# Patient Record
Sex: Female | Born: 1940 | Race: White | Hispanic: Yes | Smoking: Never smoker
Health system: Southern US, Community
[De-identification: ages and names within clinical notes are randomized; demographics above are authoritative.]

## PROBLEM LIST (undated history)

## (undated) DIAGNOSIS — Q752 Hypertelorism: Secondary | ICD-10-CM

## (undated) DIAGNOSIS — F32A Depression, unspecified: Secondary | ICD-10-CM

## (undated) DIAGNOSIS — F329 Major depressive disorder, single episode, unspecified: Secondary | ICD-10-CM

## (undated) HISTORY — DX: Major depressive disorder, single episode, unspecified: F32.9

## (undated) HISTORY — DX: Depression, unspecified: F32.A

## (undated) HISTORY — DX: Hypertelorism: Q75.2

---

## 2001-08-24 ENCOUNTER — Encounter (INDEPENDENT_AMBULATORY_CARE_PROVIDER_SITE_OTHER): Payer: Self-pay | Admitting: *Deleted

## 2001-08-24 ENCOUNTER — Ambulatory Visit (HOSPITAL_COMMUNITY): Admission: RE | Admit: 2001-08-24 | Discharge: 2001-08-24 | Payer: Self-pay | Admitting: Family Medicine

## 2001-08-24 ENCOUNTER — Encounter: Payer: Self-pay | Admitting: Family Medicine

## 2001-08-28 ENCOUNTER — Ambulatory Visit (HOSPITAL_COMMUNITY): Admission: RE | Admit: 2001-08-28 | Discharge: 2001-08-28 | Payer: Self-pay | Admitting: *Deleted

## 2003-02-14 ENCOUNTER — Encounter: Payer: Self-pay | Admitting: Family Medicine

## 2003-02-14 ENCOUNTER — Ambulatory Visit (HOSPITAL_COMMUNITY): Admission: RE | Admit: 2003-02-14 | Discharge: 2003-02-14 | Payer: Self-pay | Admitting: Family Medicine

## 2003-02-19 ENCOUNTER — Ambulatory Visit (HOSPITAL_COMMUNITY): Admission: RE | Admit: 2003-02-19 | Discharge: 2003-02-19 | Payer: Self-pay | Admitting: Family Medicine

## 2003-02-27 ENCOUNTER — Encounter: Payer: Self-pay | Admitting: Emergency Medicine

## 2003-02-27 ENCOUNTER — Emergency Department (HOSPITAL_COMMUNITY): Admission: EM | Admit: 2003-02-27 | Discharge: 2003-02-27 | Payer: Self-pay | Admitting: Emergency Medicine

## 2003-12-05 ENCOUNTER — Encounter: Admission: RE | Admit: 2003-12-05 | Discharge: 2003-12-05 | Payer: Self-pay | Admitting: Internal Medicine

## 2003-12-13 ENCOUNTER — Encounter: Admission: RE | Admit: 2003-12-13 | Discharge: 2003-12-13 | Payer: Self-pay | Admitting: Internal Medicine

## 2003-12-17 ENCOUNTER — Encounter: Admission: RE | Admit: 2003-12-17 | Discharge: 2003-12-17 | Payer: Self-pay | Admitting: Internal Medicine

## 2004-01-16 ENCOUNTER — Encounter: Admission: RE | Admit: 2004-01-16 | Discharge: 2004-01-16 | Payer: Self-pay | Admitting: Internal Medicine

## 2004-02-08 ENCOUNTER — Ambulatory Visit (HOSPITAL_COMMUNITY): Admission: RE | Admit: 2004-02-08 | Discharge: 2004-02-08 | Payer: Self-pay | Admitting: Internal Medicine

## 2004-02-14 ENCOUNTER — Ambulatory Visit (HOSPITAL_COMMUNITY): Admission: RE | Admit: 2004-02-14 | Discharge: 2004-02-14 | Payer: Self-pay | Admitting: Internal Medicine

## 2004-02-14 ENCOUNTER — Encounter: Admission: RE | Admit: 2004-02-14 | Discharge: 2004-02-14 | Payer: Self-pay | Admitting: Internal Medicine

## 2004-02-20 ENCOUNTER — Ambulatory Visit (HOSPITAL_COMMUNITY): Admission: RE | Admit: 2004-02-20 | Discharge: 2004-02-20 | Payer: Self-pay

## 2004-02-24 ENCOUNTER — Ambulatory Visit (HOSPITAL_COMMUNITY): Admission: RE | Admit: 2004-02-24 | Discharge: 2004-02-24 | Payer: Self-pay | Admitting: Internal Medicine

## 2004-04-01 ENCOUNTER — Encounter: Admission: RE | Admit: 2004-04-01 | Discharge: 2004-04-01 | Payer: Self-pay | Admitting: Internal Medicine

## 2004-04-13 ENCOUNTER — Ambulatory Visit (HOSPITAL_COMMUNITY): Admission: RE | Admit: 2004-04-13 | Discharge: 2004-04-13 | Payer: Self-pay | Admitting: Internal Medicine

## 2004-07-15 ENCOUNTER — Ambulatory Visit: Payer: Self-pay | Admitting: Internal Medicine

## 2004-07-16 ENCOUNTER — Ambulatory Visit: Payer: Self-pay | Admitting: Internal Medicine

## 2004-09-16 ENCOUNTER — Ambulatory Visit: Payer: Self-pay | Admitting: Internal Medicine

## 2005-02-17 ENCOUNTER — Ambulatory Visit (HOSPITAL_COMMUNITY): Admission: RE | Admit: 2005-02-17 | Discharge: 2005-02-17 | Payer: Self-pay | Admitting: Internal Medicine

## 2005-02-18 ENCOUNTER — Ambulatory Visit: Payer: Self-pay | Admitting: Internal Medicine

## 2005-02-23 ENCOUNTER — Ambulatory Visit: Payer: Self-pay | Admitting: Internal Medicine

## 2005-04-06 ENCOUNTER — Ambulatory Visit: Payer: Self-pay | Admitting: Obstetrics & Gynecology

## 2005-04-28 ENCOUNTER — Ambulatory Visit: Payer: Self-pay | Admitting: Internal Medicine

## 2005-05-12 ENCOUNTER — Ambulatory Visit: Payer: Self-pay | Admitting: Internal Medicine

## 2005-05-18 ENCOUNTER — Ambulatory Visit: Payer: Self-pay | Admitting: Obstetrics & Gynecology

## 2005-06-16 ENCOUNTER — Ambulatory Visit: Payer: Self-pay | Admitting: Internal Medicine

## 2006-05-02 ENCOUNTER — Ambulatory Visit: Payer: Self-pay | Admitting: Internal Medicine

## 2006-06-10 ENCOUNTER — Ambulatory Visit: Payer: Self-pay | Admitting: Internal Medicine

## 2006-08-19 ENCOUNTER — Ambulatory Visit: Payer: Self-pay | Admitting: Internal Medicine

## 2006-09-01 ENCOUNTER — Ambulatory Visit: Payer: Self-pay | Admitting: Internal Medicine

## 2006-09-01 LAB — CONVERTED CEMR LAB
ALT: 22 units/L (ref 0–40)
AST: 20 units/L (ref 0–37)
Chol/HDL Ratio, serum: 5.7
Hgb A1c MFr Bld: 7 % — ABNORMAL HIGH (ref 4.6–6.0)
LDL DIRECT: 214.2 mg/dL
VLDL: 24 mg/dL (ref 0–40)

## 2006-09-05 ENCOUNTER — Ambulatory Visit (HOSPITAL_COMMUNITY): Admission: RE | Admit: 2006-09-05 | Discharge: 2006-09-05 | Payer: Self-pay | Admitting: Internal Medicine

## 2006-09-06 ENCOUNTER — Ambulatory Visit: Payer: Self-pay | Admitting: Internal Medicine

## 2006-09-20 ENCOUNTER — Ambulatory Visit: Payer: Self-pay | Admitting: Internal Medicine

## 2006-10-06 ENCOUNTER — Encounter: Admission: RE | Admit: 2006-10-06 | Discharge: 2006-12-26 | Payer: Self-pay | Admitting: Neurology

## 2006-10-06 DIAGNOSIS — M1990 Unspecified osteoarthritis, unspecified site: Secondary | ICD-10-CM | POA: Insufficient documentation

## 2006-10-06 DIAGNOSIS — E119 Type 2 diabetes mellitus without complications: Secondary | ICD-10-CM

## 2006-10-06 DIAGNOSIS — F411 Generalized anxiety disorder: Secondary | ICD-10-CM | POA: Insufficient documentation

## 2006-10-06 DIAGNOSIS — H269 Unspecified cataract: Secondary | ICD-10-CM | POA: Insufficient documentation

## 2006-10-06 DIAGNOSIS — E785 Hyperlipidemia, unspecified: Secondary | ICD-10-CM

## 2006-10-06 DIAGNOSIS — IMO0001 Reserved for inherently not codable concepts without codable children: Secondary | ICD-10-CM

## 2006-10-06 DIAGNOSIS — D320 Benign neoplasm of cerebral meninges: Secondary | ICD-10-CM | POA: Insufficient documentation

## 2006-10-06 DIAGNOSIS — H409 Unspecified glaucoma: Secondary | ICD-10-CM | POA: Insufficient documentation

## 2006-10-06 DIAGNOSIS — M3500 Sicca syndrome, unspecified: Secondary | ICD-10-CM

## 2006-10-06 DIAGNOSIS — E039 Hypothyroidism, unspecified: Secondary | ICD-10-CM | POA: Insufficient documentation

## 2006-10-06 DIAGNOSIS — F329 Major depressive disorder, single episode, unspecified: Secondary | ICD-10-CM

## 2006-10-06 DIAGNOSIS — K219 Gastro-esophageal reflux disease without esophagitis: Secondary | ICD-10-CM | POA: Insufficient documentation

## 2006-10-06 DIAGNOSIS — R809 Proteinuria, unspecified: Secondary | ICD-10-CM | POA: Insufficient documentation

## 2007-01-19 ENCOUNTER — Telehealth: Payer: Self-pay | Admitting: *Deleted

## 2007-03-22 ENCOUNTER — Ambulatory Visit: Payer: Self-pay | Admitting: Internal Medicine

## 2007-03-24 ENCOUNTER — Ambulatory Visit: Payer: Self-pay | Admitting: Internal Medicine

## 2007-03-28 ENCOUNTER — Encounter (INDEPENDENT_AMBULATORY_CARE_PROVIDER_SITE_OTHER): Payer: Self-pay | Admitting: *Deleted

## 2007-03-28 LAB — CONVERTED CEMR LAB
ALT: 21 units/L (ref 0–40)
AST: 23 units/L (ref 0–37)
CO2: 31 meq/L (ref 19–32)
Chloride: 107 meq/L (ref 96–112)
Creatinine, Ser: 0.8 mg/dL (ref 0.4–1.2)
Direct LDL: 122.6 mg/dL
Hgb A1c MFr Bld: 8 % — ABNORMAL HIGH (ref 4.6–6.0)
Potassium: 3.2 meq/L — ABNORMAL LOW (ref 3.5–5.1)
Sodium: 145 meq/L (ref 135–145)
TSH: 0.82 microintl units/mL (ref 0.35–5.50)
Triglycerides: 133 mg/dL (ref 0–149)

## 2007-04-05 ENCOUNTER — Ambulatory Visit: Payer: Self-pay | Admitting: Internal Medicine

## 2007-05-02 ENCOUNTER — Ambulatory Visit: Payer: Self-pay | Admitting: Internal Medicine

## 2007-05-16 ENCOUNTER — Ambulatory Visit: Payer: Self-pay | Admitting: Internal Medicine

## 2007-05-16 ENCOUNTER — Encounter: Payer: Self-pay | Admitting: Internal Medicine

## 2007-05-26 ENCOUNTER — Telehealth (INDEPENDENT_AMBULATORY_CARE_PROVIDER_SITE_OTHER): Payer: Self-pay | Admitting: *Deleted

## 2007-06-19 ENCOUNTER — Telehealth (INDEPENDENT_AMBULATORY_CARE_PROVIDER_SITE_OTHER): Payer: Self-pay | Admitting: *Deleted

## 2007-07-31 ENCOUNTER — Encounter (INDEPENDENT_AMBULATORY_CARE_PROVIDER_SITE_OTHER): Payer: Self-pay | Admitting: *Deleted

## 2007-08-23 ENCOUNTER — Telehealth (INDEPENDENT_AMBULATORY_CARE_PROVIDER_SITE_OTHER): Payer: Self-pay | Admitting: *Deleted

## 2007-08-23 ENCOUNTER — Ambulatory Visit: Payer: Self-pay | Admitting: Internal Medicine

## 2007-08-23 DIAGNOSIS — J309 Allergic rhinitis, unspecified: Secondary | ICD-10-CM | POA: Insufficient documentation

## 2007-08-28 LAB — CONVERTED CEMR LAB
BUN: 11 mg/dL (ref 6–23)
Calcium: 9.8 mg/dL (ref 8.4–10.5)
Glucose, Bld: 179 mg/dL — ABNORMAL HIGH (ref 70–99)
Hgb A1c MFr Bld: 7.5 % — ABNORMAL HIGH (ref 4.6–6.0)
Sodium: 139 meq/L (ref 135–145)
TSH: 1.15 microintl units/mL (ref 0.35–5.50)

## 2007-09-27 ENCOUNTER — Ambulatory Visit: Payer: Self-pay | Admitting: Internal Medicine

## 2007-09-27 ENCOUNTER — Telehealth (INDEPENDENT_AMBULATORY_CARE_PROVIDER_SITE_OTHER): Payer: Self-pay | Admitting: *Deleted

## 2007-09-29 ENCOUNTER — Encounter: Admission: RE | Admit: 2007-09-29 | Discharge: 2007-09-29 | Payer: Self-pay | Admitting: Internal Medicine

## 2007-10-03 ENCOUNTER — Telehealth (INDEPENDENT_AMBULATORY_CARE_PROVIDER_SITE_OTHER): Payer: Self-pay | Admitting: *Deleted

## 2007-10-05 ENCOUNTER — Telehealth: Payer: Self-pay | Admitting: Internal Medicine

## 2007-10-06 ENCOUNTER — Telehealth (INDEPENDENT_AMBULATORY_CARE_PROVIDER_SITE_OTHER): Payer: Self-pay | Admitting: *Deleted

## 2007-10-12 ENCOUNTER — Ambulatory Visit: Payer: Self-pay | Admitting: Cardiology

## 2007-10-13 ENCOUNTER — Telehealth: Payer: Self-pay | Admitting: Internal Medicine

## 2007-10-24 ENCOUNTER — Telehealth (INDEPENDENT_AMBULATORY_CARE_PROVIDER_SITE_OTHER): Payer: Self-pay | Admitting: *Deleted

## 2007-10-24 ENCOUNTER — Ambulatory Visit: Payer: Self-pay | Admitting: Internal Medicine

## 2007-10-25 ENCOUNTER — Telehealth (INDEPENDENT_AMBULATORY_CARE_PROVIDER_SITE_OTHER): Payer: Self-pay | Admitting: *Deleted

## 2007-11-03 ENCOUNTER — Telehealth: Payer: Self-pay | Admitting: Internal Medicine

## 2007-11-04 ENCOUNTER — Ambulatory Visit: Payer: Self-pay | Admitting: Family Medicine

## 2007-11-09 ENCOUNTER — Ambulatory Visit: Payer: Self-pay | Admitting: Internal Medicine

## 2007-11-10 ENCOUNTER — Telehealth (INDEPENDENT_AMBULATORY_CARE_PROVIDER_SITE_OTHER): Payer: Self-pay | Admitting: *Deleted

## 2007-11-10 LAB — CONVERTED CEMR LAB
AST: 18 units/L (ref 0–37)
Direct LDL: 220 mg/dL
Eosinophils Absolute: 0.2 10*3/uL (ref 0.0–0.6)
Eosinophils Relative: 2.7 % (ref 0.0–5.0)
HDL: 39.7 mg/dL (ref >39.0)
MCHC: 33.9 g/dL (ref 30.0–36.0)
MCV: 83.1 fL (ref 78.0–100.0)
Microalb Creat Ratio: 74 mg/g — ABNORMAL HIGH (ref 0.0–30.0)
Neutro Abs: 3.3 10*3/uL (ref 1.4–7.7)
Neutrophils Relative %: 58 % (ref 43.0–77.0)
RBC: 4.75 M/uL (ref 3.87–5.11)
RDW: 13.1 % (ref 11.5–14.6)
Total CHOL/HDL Ratio: 8.2
Triglycerides: 244 mg/dL (ref 0–149)
WBC: 5.7 10*3/uL (ref 4.5–10.5)

## 2007-11-13 ENCOUNTER — Telehealth (INDEPENDENT_AMBULATORY_CARE_PROVIDER_SITE_OTHER): Payer: Self-pay | Admitting: *Deleted

## 2007-11-17 ENCOUNTER — Ambulatory Visit: Payer: Self-pay

## 2007-11-17 ENCOUNTER — Encounter: Payer: Self-pay | Admitting: Internal Medicine

## 2007-11-22 ENCOUNTER — Ambulatory Visit (HOSPITAL_COMMUNITY): Admission: RE | Admit: 2007-11-22 | Discharge: 2007-11-22 | Payer: Self-pay | Admitting: Internal Medicine

## 2007-11-24 ENCOUNTER — Ambulatory Visit: Payer: Self-pay | Admitting: Internal Medicine

## 2007-11-25 ENCOUNTER — Encounter: Payer: Self-pay | Admitting: Internal Medicine

## 2007-11-27 ENCOUNTER — Telehealth (INDEPENDENT_AMBULATORY_CARE_PROVIDER_SITE_OTHER): Payer: Self-pay | Admitting: *Deleted

## 2007-11-30 ENCOUNTER — Encounter: Admission: RE | Admit: 2007-11-30 | Discharge: 2007-11-30 | Payer: Self-pay | Admitting: Internal Medicine

## 2007-12-06 ENCOUNTER — Encounter: Payer: Self-pay | Admitting: Internal Medicine

## 2007-12-13 ENCOUNTER — Ambulatory Visit: Payer: Self-pay | Admitting: Obstetrics & Gynecology

## 2008-01-01 ENCOUNTER — Telehealth (INDEPENDENT_AMBULATORY_CARE_PROVIDER_SITE_OTHER): Payer: Self-pay | Admitting: *Deleted

## 2008-01-01 ENCOUNTER — Telehealth: Payer: Self-pay | Admitting: Internal Medicine

## 2008-06-28 ENCOUNTER — Telehealth (INDEPENDENT_AMBULATORY_CARE_PROVIDER_SITE_OTHER): Payer: Self-pay | Admitting: *Deleted

## 2008-07-17 ENCOUNTER — Encounter: Admission: RE | Admit: 2008-07-17 | Discharge: 2008-07-17 | Payer: Self-pay | Admitting: Family Medicine

## 2008-12-06 ENCOUNTER — Encounter: Admission: RE | Admit: 2008-12-06 | Discharge: 2008-12-06 | Payer: Self-pay | Admitting: Family Medicine

## 2009-06-27 IMAGING — CT CT ANGIO CHEST
1 of 4 series · 12 of 36 positions shown · IV contrast (Omnipaque 300)
Comparison: None.

CLINICAL DATA: Abnormal chest x-ray. 
 CT ANGIOGRAPHY OF CHEST:
TECHNIQUE: Multidetector CT imaging of the chest was performed during bolus injection of intravenous contrast.  Multiplanar CT angiographic image reconstructions were generated to evaluate the vascular anatomy.
 Contrast:  80 cc Omnipaque 300.

[Series 2: thoracica_wo 3.0 b30f st · axial · 0.69mm/px · z∈[-268,-37]mm · 12 of 92 slices shown]
[im 8/92  lung]
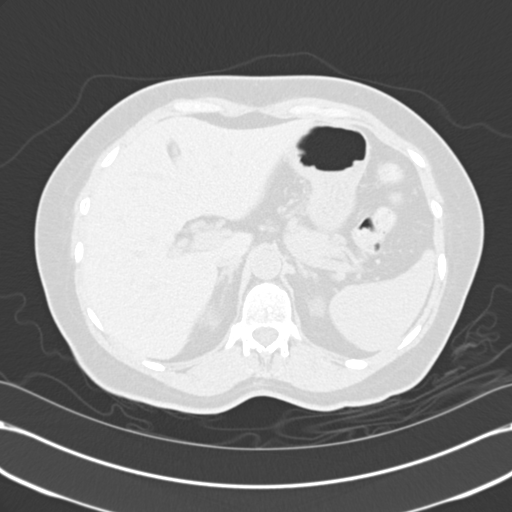
[im 15/92  mediastinal]
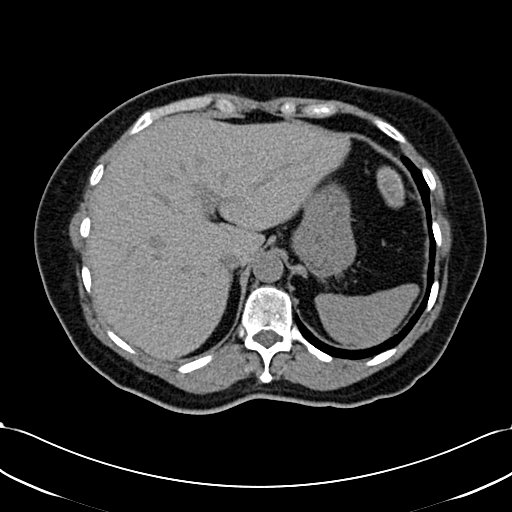
[im 22/92  lung]
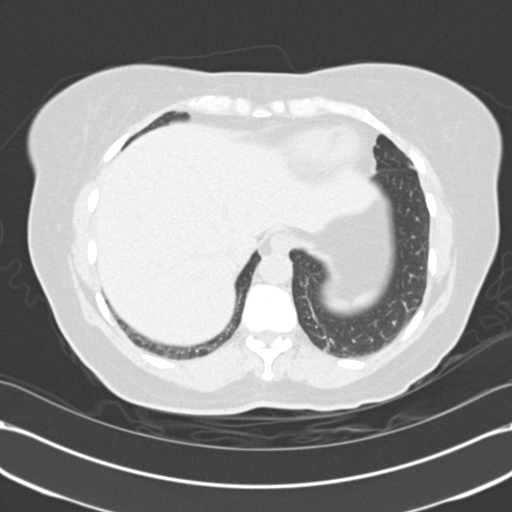
[im 29/92  mediastinal]
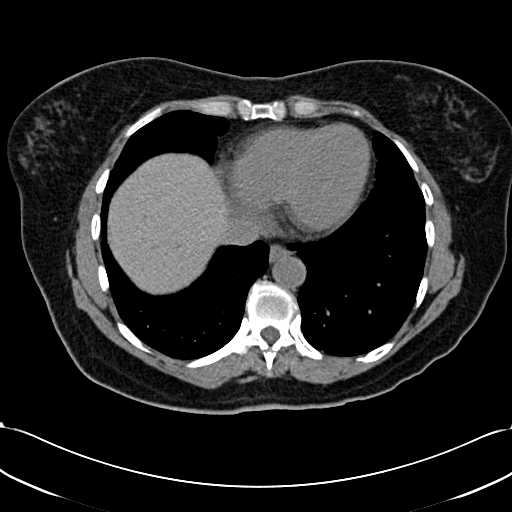
[im 36/92  lung]
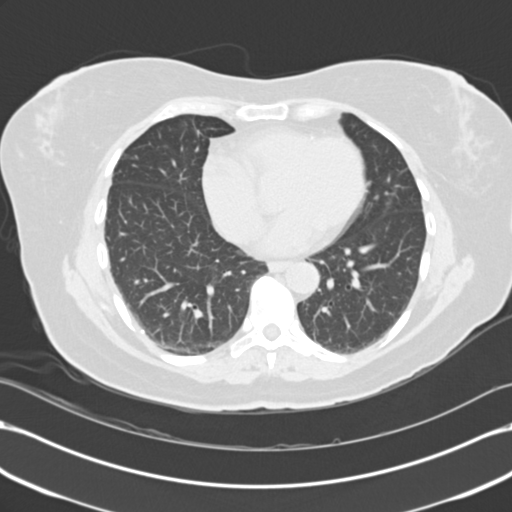
[im 43/92  mediastinal]
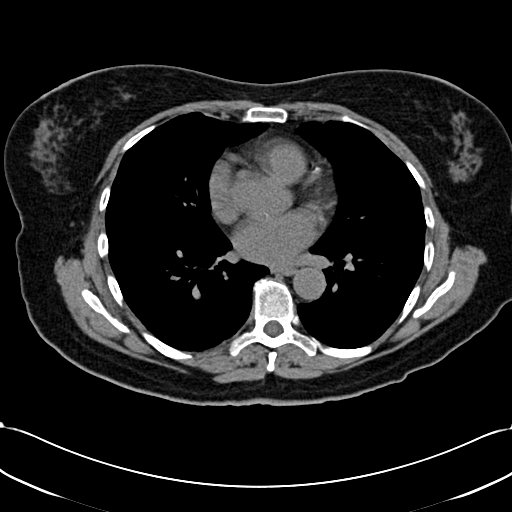
[im 50/92  lung]
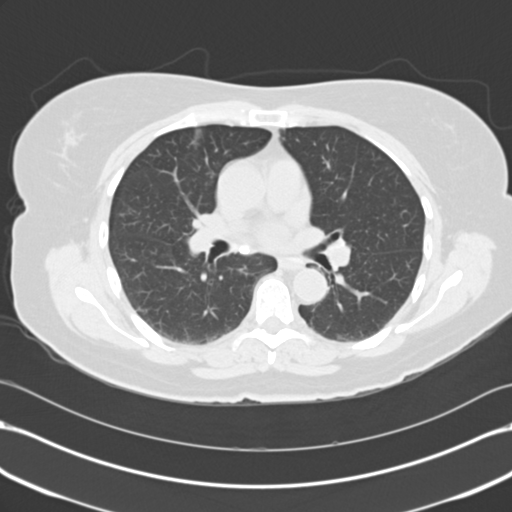
[im 57/92  mediastinal]
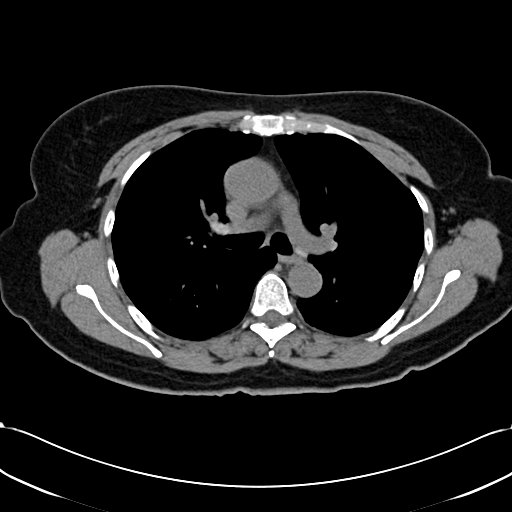
[im 64/92  lung]
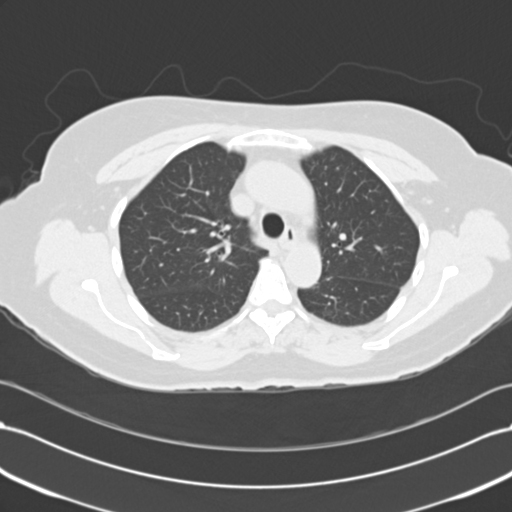
[im 71/92  mediastinal]
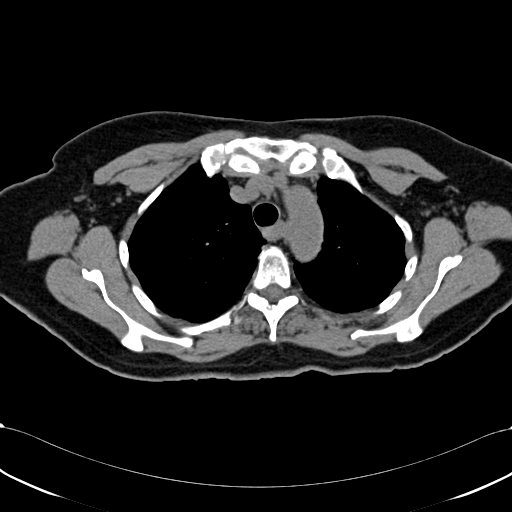
[im 78/92  lung]
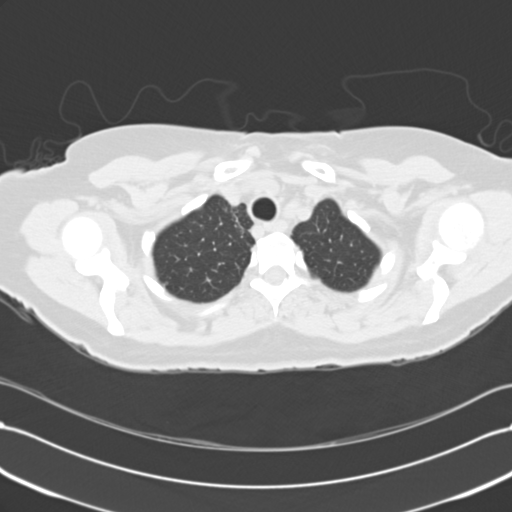
[im 85/92  mediastinal]
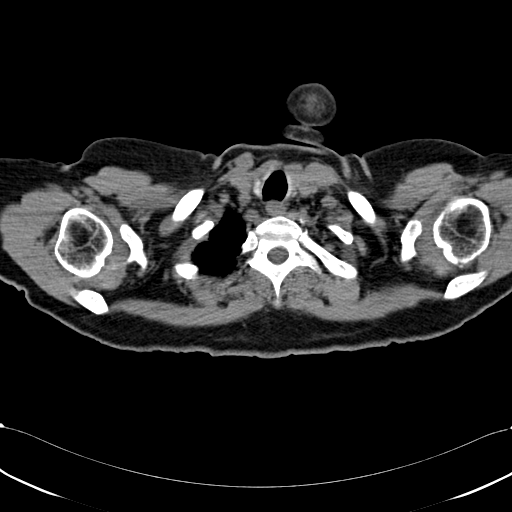

[12 of 36 positions shown; findings below may reference images not displayed]

FINDINGS: Coronary artery calcifications are present.  There is a calcified subcarinal lymph node compatible with old granulomatous disease.  Negative abnormal mediastinal or hilar adenopathy by CT measurement criteria. 
 No filling defects are seen in the pulmonary arterial tree to suggest acute pulmonary thromboembolism.  The aorta is normal in caliber without dissection, transection, or focal saccular aneurysm.  An aberrant right subclavian artery is noted.  Mild atherosclerotic changes of the descending aorta are seen without significant penetrating ulcer or aneurysm.  
 No pneumothoraces or effusions are seen.
 Linear atelectasis in the right middle lobe and lung bases is present.  No focal mass or consolidation.  The airway is patent.
IMPRESSION: 1.  No evidence of aortic aneurysm or dissection. 
 2.  No pulmonary thromboembolism. 
 3.  Aberrant right subclavian artery, a normal variant and of no clinical concern. 
 4.  Coronary artery calcifications. 
 5.  Old granulomatous disease.

## 2011-03-09 NOTE — Assessment & Plan Note (Signed)
[facility]                                 ON-CALL NOTE   AARON, BOEH                          MRN:          [id number]  DATE:11/04/2007                            DOB:          July 24, 1941    TIME OF CALL:  9:32am.   TELEPHONE NUMBER:  [phone]   OBJECTIVE:  The patient has a bad cold and was seen by Dr. Drue Novel on the  3rd of December and then by Dr. Alwyn Ren for a cold. She still has it. She  wants to be seen. She was told to come in to the Saturday clinic.   PRIMARY CARE PHYSICIAN:  Dr. Drue Novel.   HOME OFFICE:  [facility].     Arta Silence, MD  Electronically Signed    RNS/MedQ  DD: 11/04/2007  DT: 11/05/2007  Job #: [id number]

## 2011-03-12 NOTE — Assessment & Plan Note (Signed)
[facility]                           GASTROENTEROLOGY OFFICE NOTE   Zoe Kennedy, Zoe Kennedy                          MRN:          [id number]  DATE:09/06/2006                            DOB:          December 20, 1940    REASON FOR CONSULTATION:  Abdominal pain, altered bowel habits and reflux  disease.   HISTORY:  This is a 70 year old Hispanic female with a history of diabetes  mellitus, hypothyroidism, gastroesophageal reflux disease, and low back pain  with resultant disability. She is referred through the courtesy of Dr. Drue Novel  regarding the above-listed issues and for consideration of colonoscopy. The  patient reports having undergone either a sigmoidoscopy or colonoscopy in  [place] greater than 5 years ago. This apparently for the purposes of  screening. She is uncertain of the results. She also underwent upper  endoscopy for chronic reflux disease. She is not aware of any particular  abnormalities. She has chronic problems with indigestion and heartburn. When  taking a proton pump inhibitor such as omeprazole, she does well. She denies  dysphagia, vomiting or weight loss. She recently had a prescription for  omeprazole, which expired. She requests a prescription today.   Next, she reports a 87-month history of constipation alternating with  diarrhea. She states that this is new. Also, some lower abdominal discomfort  associated with defecation. Pain seems to be improved after going to the  bathroom. She will take Imodium for diarrhea. Does not take laxatives for  constipation. She denies rectal bleeding. No appetite change or weight loss  as previously mentioned. She comprehends well, but is a bit difficult to  understand when she speaks.   PAST MEDICAL HISTORY:  1. Diabetes.  2. Hypothyroidism.  3. Chronic back pain.  4. Gastroesophageal reflux disease.  5. Status post hysterectomy with bilateral oophorectomy in 2000.  6. Status post rhinoplasty.  7. Status post face lift.  8. Status post unspecified right mastoid surgery, complicated by right-      sided hearing loss.   ALLERGIES:  No known drug allergies.   CURRENT MEDICATIONS:  1. Humulin insulin 25 units subcutaneous q. p.m.  2. Metformin 500 mg b.i.d.  3. Hydrochlorothiazide 25 mg daily.  4. Levothyroxine 50 mcg daily.  5. Zoloft 200 mg daily.  6. Lipitor 40 mg daily.  7. Estrace cream.  8. Omeprazole 20 mg daily (recently without medication).   FAMILY HISTORY:  No family history of gastrointestinal malignancy. Mother  with diabetes, father with heart disease.   SOCIAL HISTORY:  The patient is married without children. She lives with her  husband. She attended college. She is disabled from her back pain. She does  not smoke or use alcohol.   REVIEW OF SYSTEMS:  Per diagnostic evaluation form.   PHYSICAL EXAMINATION:  Well-appearing female in no acute distress. Blood  pressure is 130/82, heart rate is 72 and regular, weight is 127 pounds.  HEENT: Sclera anicteric. Conjunctivae are pink. Oral mucosa intact. Her nose  is bandaged from prior surgery. There is no adenopathy.  LUNGS:  Are clear.  HEART: Is regular.  ABDOMEN:  Soft without tenderness, mass or hernia. Good bowel sounds heard.  No organomegaly.  RECTAL: Is deferred.  EXTREMITIES: Are without edema.   IMPRESSION:  73. A 70 year old female who presents with 18-month history of alternating      bowel habits as well as lower abdominal discomfort associated with and      improved after defecation. Features most consistent with irritable      bowel. Unclear to me, though her symptoms might have been going on      longer than 4 months. In any event, she requires investigation.  2. Status post hysterectomy and bilateral salpingo-oophorectomy per the      patient report.  3. Gastroesophageal reflux disease without alarm symptoms. Currently      recurrent heartburn off omeprazole.  4. Diabetes mellitus.  5. Other  general medical problems as discussed above.   RECOMMENDATIONS:  1. Colonoscopy with polypectomy if indicated. Ashby Dawes of the procedure as      well as the risks, benefits and alternatives have been reviewed. She      understood and agreed to proceed. We will hold her oral hypoglycemic      agent the day of the exam and recommend one-half of her normal insulin      the evening prior to the exam.  2. Reflux precautions.  3. Omeprazole 20 mg daily. A prescription with multiple refills has been      provided.  4. Ongoing general medical care with Dr. Drue Novel.    Wilhemina Bonito. Eda Keys., MD  Electronically Signed   JNP/MedQ  DD: 09/07/2006  DT: 09/07/2006  Job #: [id number]   cc:   Willow Ora, MD

## 2011-03-12 NOTE — Group Therapy Note (Signed)
NAMEKALANY, DIEKMANN                 ACCOUNT NO.:  [id number]   MEDICAL RECORD NO.:  [id number]          PATIENT TYPE:  WOC   LOCATION:  [facility]                   FACILITY:  [facility]   PHYSICIAN:  Elsie Lincoln, MD      DATE OF BIRTH:  24-Jul-1941   DATE OF SERVICE:  04/06/2005                                    CLINIC NOTE   Patient is a 70 year old G0 female who is menopausal since age 22 who  presents for a Pap smear.  She also has a complaint of dyspareunia for three  years.  Patient is an [facility] patient from [facility] and that doctor apparently  told her she needs yearly Pap smears, although she has had a hysterectomy  for benign reasons and no longer has a cervix.  She has never had any  abnormal Pap smears.  We did some education today to explain that she no  longer needs Pap smears as she does not have a cervix and has never had any  abnormal Pap smears.  She does need a pelvic examination and breast  examination each year and if she remains sexually active with new partners  she will need to be screened for sexually transmitted diseases.  Currently,  the patient has been having dyspareunia for three years.  It is at the  introitus and she feels like there is a muscle falling in.  She has never  been worked up for this in the past.   PAST MEDICAL HISTORY:  1.  Diabetes for 20 years.  2.  Hypothyroidism.  3.  Fibromyalgia.  4.  Glaucoma.  5.  Cataracts.  6.  Hypercholesterolemia.  7.  Sjogren's syndrome.   PAST SURGICAL HISTORY:  Rhinoplasty.   PAST GYNECOLOGICAL HISTORY:  Hysterectomy in 2000 for fibroid tumors and  pelvic pain.  Menopause at age 72.  Never has been pregnant.  No history of  sexually transmitted diseases.  No history of abnormal Pap smear.  No  history of ovarian cyst.   ALLERGIES:  None.   MEDICATIONS:  Metformin, Glyburide, levothyroxine, Lipitor, Zetia, some eye  drops, and insulin.   Last mammogram April 2006.  Last Pap smear 2001.   REVIEW OF  SYSTEMS:  No chest pain, shortness of breath, nausea, vomiting,  diarrhea.  Patient does occasionally leak urine approximately once a day  with coughing or sneezing.  At this time the patient states she does not  want to have any intervention done.   PHYSICAL EXAMINATION:  GENERAL:  Well-nourished, well-developed, in no  apparent distress.  HEENT:  Normocephalic, atraumatic.  Has a slight tremor when she speaks.  Her head nods.  BREASTS:  Supple.  No masses.  No lymphadenopathy.  No skin changes.  ABDOMEN:  Soft, nontender, nondistended.  No rebound.  No guarding.  Incision Pfannenstiel well healed.  GENITALIA:  Tanner V.  Vulva no lesion.  Vagina atrophic.  Patient also has  a good Kegel and is able to relax her levator muscles.  With a wet Q-tip her  labia majora, minora, and Vespula were palpated.  The patient has a  marked  sensitivity at the [facility] glands and vestibular glands all around the  introitus and hymenal ring.  On bimanual there is no masses, nontender.  Rectovaginal nontender.  No masses.   ASSESSMENT/PLAN:  A 70 year old female with dyspareunia for three years.  1.  Patient does have atrophic vaginitis.  Will treat with Vagifem or      Estrace.  Patient given prescriptions for both.  She will see which one      she can afford.  She will do q.h.s. for two weeks, then twice a week.  2.  Mammogram was done April 2006.  Will get the result.  3.  Patient needs Hemoccult blood testing next time she comes.  4.  We need records from her hysterectomy in [place] next time she comes.  5.  Will do an STD screening next time she comes.  6.  Return to clinic in six weeks.       KL/MEDQ  D:  04/06/2005  T:  04/06/2005  Job:  [id number]

## 2011-03-12 NOTE — Group Therapy Note (Signed)
NAMEJONELLE, BANN                 ACCOUNT NO.:  [id number]   MEDICAL RECORD NO.:  [id number]          PATIENT TYPE:  WOC   LOCATION[facility]  [facility]                   FACILITY:  [facility]   PHYSICIAN:  Elsie Lincoln, MD      DATE OF BIRTH:  01/16/1941   DATE OF SERVICE:  05/18/2005                                    CLINIC NOTE   Patient is a 70 year old female who presents for followup for atrophic  vaginitis.  She has been on the Estrace cream with good results.  She no  longer has the constant pain, however, did have sex once since then and had  a little discomfort, but nothing excruciating as before.  When inquired  about the patient's sexual relationship and the first thing she spends time  with she says she is not very attracted to him.  He is borderline mentally  retarded with an IQ of approximately 43.  She said it is a relationship of  convenience where he needs her for mental help and she needs him for  physical help so she doubts that sex will ever be pleasant with this man.  He is not abusive.  We talked at length about libido and menopause and I  think she has come to terms with why her libido is low at this point in her  life with her current medical conditions and also the lack of sexual arousal  involving her partner.  Patient was given both prescription for Estrace  cream and Vagifem and she is going to choose the cheaper one after  investigating these at pharmacies.  Patient will return in one year.  BIRADS  1 mammogram was noted in April 2006 and she no longer needs Pap smears as  she had hysterectomy for benign reasons.       KL/MEDQ  D:  05/18/2005  T:  05/19/2005  Job:  [id number]

## 2012-06-06 ENCOUNTER — Encounter: Payer: Self-pay | Admitting: Internal Medicine

## 2012-12-12 ENCOUNTER — Ambulatory Visit (INDEPENDENT_AMBULATORY_CARE_PROVIDER_SITE_OTHER): Payer: Medicare HMO | Admitting: Psychiatry

## 2012-12-12 ENCOUNTER — Encounter (HOSPITAL_COMMUNITY): Payer: Self-pay | Admitting: Psychiatry

## 2012-12-12 VITALS — BP 138/98 | Ht 62.5 in | Wt 128.0 lb

## 2012-12-12 DIAGNOSIS — F3181 Bipolar II disorder: Secondary | ICD-10-CM | POA: Insufficient documentation

## 2012-12-12 DIAGNOSIS — F3130 Bipolar disorder, current episode depressed, mild or moderate severity, unspecified: Secondary | ICD-10-CM

## 2012-12-12 MED ORDER — SERTRALINE HCL 100 MG PO TABS: 100.0000 mg | ORAL_TABLET | Freq: Every day | ORAL | Status: DC

## 2012-12-12 MED ORDER — LAMOTRIGINE 25 MG PO TABS: ORAL_TABLET | ORAL | Status: DC
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
## 2013-01-09 ENCOUNTER — Encounter (HOSPITAL_COMMUNITY): Payer: Self-pay | Admitting: Psychiatry

## 2013-01-09 ENCOUNTER — Ambulatory Visit (INDEPENDENT_AMBULATORY_CARE_PROVIDER_SITE_OTHER): Payer: Medicare HMO | Admitting: Psychiatry

## 2013-01-09 VITALS — BP 124/82 | Ht 62.5 in | Wt 137.0 lb

## 2013-01-09 DIAGNOSIS — F3189 Other bipolar disorder: Secondary | ICD-10-CM

## 2013-01-09 DIAGNOSIS — F3181 Bipolar II disorder: Secondary | ICD-10-CM

## 2013-01-09 MED ORDER — LAMOTRIGINE 100 MG PO TABS: 100.0000 mg | ORAL_TABLET | Freq: Every day | ORAL | Status: DC

## 2013-01-09 NOTE — Progress Notes (Signed)
   [facility] Follow-up Outpatient Visit  Maille Halliwell September 09, 1941   Subjective: The patient is a 72 year old female who was seen for an initial psychiatric assessment on 12/12/2012 after referral by her neurologist. The patient had a substantial psychiatric history, with a prior diagnosis of bipolar disorder. Her only medication at our initial appointment was Zoloft. The patient was having issues with irritability and anger. She felt that her prior lithium had destroyed her thyroid. At her initial appointment, I started her on Lamictal for mood stability. I started with initial dose of 25 mg daily and increase to 50 mg daily. I also continued her Zoloft 100 mg daily. The patient presents today. She is no longer seeing Dr. Craige Cotta. She reports good sleep with an over-the-counter sleep agent. She feels like the Lamictal is helping, and she has been a little more even. Her appetite has been good. She has had occasional low mood. She reports that she rarely cries. She normally has an anger reaction. She has not been as angry. She only became irritated once this week. She will be starting therapy tomorrow with Merlene Morse. The patient reports her blood sugars have been up and down. She uses NovoLog as needed. [facility]'s followed by [facility] for her primary care. The patient tells stories of her life in [place]. 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 She was considered to be a spinster. She answered an ad, which is how she met her husband. He is 10 years older. This is what brought her to the [place].  Filed Vitals:   01/09/13 1306  BP: 124/82    Mental Status Examination  Appearance: Neatly dressed Alert: Yes Attention: good  Cooperative: Yes Eye Contact: Good Speech: Regular rate rhythm and volume. Strong accent. Psychomotor Activity: Normal Memory/Concentration: Intact Oriented: person, place, time/date and situation Mood: Euthymic Affect: Appropriate Thought  Processes and Associations: Logical Fund of Knowledge: Fair Thought Content: No suicidal homicidal thoughts Insight: Fair Judgement: Fair  Diagnosis: Bipolar disorder type II, most recent episode manic  Treatment Plan: I will increase her Lamictal to 100 mg daily. I will continue her Zoloft 100 mg daily. I will see her back in one month. Patient will start therapy tomorrow. Patient may call with concerns.  Jamse Mead, MD

## 2013-01-10 ENCOUNTER — Ambulatory Visit (INDEPENDENT_AMBULATORY_CARE_PROVIDER_SITE_OTHER): Payer: Medicare HMO | Admitting: Licensed Clinical Social Worker

## 2013-01-10 DIAGNOSIS — F3130 Bipolar disorder, current episode depressed, mild or moderate severity, unspecified: Secondary | ICD-10-CM
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
## 2013-02-01 ENCOUNTER — Encounter: Payer: Self-pay | Admitting: Internal Medicine

## 2013-02-13 ENCOUNTER — Ambulatory Visit (HOSPITAL_COMMUNITY): Payer: Self-pay | Admitting: Licensed Clinical Social Worker

## 2013-02-14 ENCOUNTER — Ambulatory Visit (HOSPITAL_COMMUNITY): Payer: Self-pay | Admitting: Psychiatry

## 2013-03-16 ENCOUNTER — Encounter (HOSPITAL_COMMUNITY): Payer: Self-pay | Admitting: Psychiatry

## 2013-03-16 ENCOUNTER — Ambulatory Visit (INDEPENDENT_AMBULATORY_CARE_PROVIDER_SITE_OTHER): Payer: Medicare HMO | Admitting: Psychiatry

## 2013-03-16 VITALS — BP 138/85 | Ht 62.5 in | Wt 142.0 lb

## 2013-03-16 DIAGNOSIS — F3189 Other bipolar disorder: Secondary | ICD-10-CM

## 2013-03-16 DIAGNOSIS — F3181 Bipolar II disorder: Secondary | ICD-10-CM

## 2013-03-16 MED ORDER — LAMOTRIGINE 150 MG PO TABS: 150.0000 mg | ORAL_TABLET | Freq: Every day | ORAL | Status: DC

## 2013-03-16 MED ORDER — SERTRALINE HCL 100 MG PO TABS: 100.0000 mg | ORAL_TABLET | Freq: Every day | ORAL | Status: DC

## 2013-03-16 NOTE — Progress Notes (Signed)
[facility] Behavioral Health Follow-up Outpatient Visit  Zoe Kennedy 01-01-1941   Subjective: The patient is a 72 year old female who has been followed by [facility] since February of 2014. The patient had a substantial psychiatric history, with a prior diagnosis of bipolar disorder. I have diagnosed with bipolar disorder type II. At her last appointment, I continued her Zoloft 100 mg daily, and increased her Lamictal to 100 mg daily since patient was seeing improvement from it. She presents today. I have not seen her since March. She was sick last month. She had 2 dental extractions. Patient is concerned right now about finances. She has been getting a lot of bills. She is angry that her family does not help out financially, since she help them out so much in the past. She reports only one nephew will contact her. She has 5. He did visit in September with his wife and children and was very disrespectful. The patient feels the Lamictal is helping. She is more even. She does not feel 100%, but feels that she is on her way. She's only had 2 issues with 2 people since last appointment. She is less aggressive and agitated. She is sleeping well with over-the-counter sleep medicine. She and her husband are doing well. She has not been social or getting out. She likes to read. She's been reading since the age of 12, and feels it helps her [place].  Filed Vitals:   03/16/13 1502  BP: 138/85   Active Ambulatory Problems    Diagnosis Date Noted  . MENINGIOMA 10/06/2006  . HYPOTHYROIDISM 10/06/2006  . DIABETES MELLITUS, TYPE II 10/06/2006  . HYPERLIPIDEMIA 10/06/2006  . ANXIETY 10/06/2006  . DEPRESSION 10/06/2006  . GLAUCOMA NOS 10/06/2006  . CATARACT NOS 10/06/2006  . ALLERGIC RHINITIS 08/23/2007  . GERD 10/06/2006  . SJOGREN'S SYNDROME 10/06/2006  . OSTEOARTHRITIS 10/06/2006  . FIBROMYALGIA 10/06/2006  . 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 Bipolar II disorder, most recent episode major  depressive 12/12/2012   Resolved Ambulatory Problems    Diagnosis Date Noted  . No Resolved Ambulatory Problems   No Additional Past Medical History   Current Outpatient Prescriptions on File Prior to Visit  Medication Sig Dispense Refill  . amLODipine (NORVASC) 10 MG tablet       . amLODipine (NORVASC) 5 MG tablet       . atorvastatin (LIPITOR) 10 MG tablet       . levothyroxine (SYNTHROID, LEVOTHROID) 50 MCG tablet       . losartan (COZAAR) 50 MG tablet       . pravastatin (PRAVACHOL) 40 MG tablet        No current facility-administered medications on file prior to visit.   Review of Systems - General ROS: negative for - sleep disturbance or weight loss Psychological ROS: negative for - anxiety or depression Cardiovascular ROS: no chest pain or dyspnea on exertion Musculoskeletal ROS: negative for - gait disturbance or muscular weakness Neurological ROS: negative for - dizziness, headaches or seizures  Mental Status Examination  Appearance: Neatly dressed Alert: Yes Attention: good  Cooperative: Yes Eye Contact: Good Speech: Regular rate rhythm and volume. Strong accent. Psychomotor Activity: Normal Memory/Concentration: Intact Oriented: person, place, time/date and situation Mood: Euthymic Affect: Appropriate Thought Processes and Associations: Logical Fund of Knowledge: Fair Thought Content: No suicidal homicidal thoughts Insight: Fair Judgement: Fair  Diagnosis: Bipolar disorder type II, most recent episode manic  Treatment Plan: I will increase her Lamictal to 150 mg daily. I will continue  her Zoloft 100 mg daily. I will see her back in 6 weeks.Patient may call with concerns.  Jamse Mead, MD

## 2013-05-01 ENCOUNTER — Ambulatory Visit (HOSPITAL_COMMUNITY): Payer: Self-pay | Admitting: Psychiatry

## 2014-08-15 DIAGNOSIS — I10 Essential (primary) hypertension: Secondary | ICD-10-CM | POA: Insufficient documentation

## 2015-12-24 DIAGNOSIS — G4700 Insomnia, unspecified: Secondary | ICD-10-CM | POA: Insufficient documentation

## 2015-12-24 DIAGNOSIS — H9071 Mixed conductive and sensorineural hearing loss, unilateral, right ear, with unrestricted hearing on the contralateral side: Secondary | ICD-10-CM | POA: Insufficient documentation

## 2016-01-14 DIAGNOSIS — H95191 Other disorders following mastoidectomy, right ear: Secondary | ICD-10-CM | POA: Insufficient documentation

## 2016-01-21 DIAGNOSIS — H40013 Open angle with borderline findings, low risk, bilateral: Secondary | ICD-10-CM | POA: Insufficient documentation

## 2016-04-12 DIAGNOSIS — H25011 Cortical age-related cataract, right eye: Secondary | ICD-10-CM | POA: Insufficient documentation

## 2016-05-03 DIAGNOSIS — H2511 Age-related nuclear cataract, right eye: Secondary | ICD-10-CM | POA: Insufficient documentation

## 2016-06-23 DIAGNOSIS — Z961 Presence of intraocular lens: Secondary | ICD-10-CM | POA: Insufficient documentation

## 2016-10-29 DIAGNOSIS — H43811 Vitreous degeneration, right eye: Secondary | ICD-10-CM | POA: Insufficient documentation

## 2016-10-29 DIAGNOSIS — D2210 Melanocytic nevi of unspecified eyelid, including canthus: Secondary | ICD-10-CM | POA: Insufficient documentation

## 2016-10-29 DIAGNOSIS — H52223 Regular astigmatism, bilateral: Secondary | ICD-10-CM | POA: Insufficient documentation

## 2017-03-18 DIAGNOSIS — L219 Seborrheic dermatitis, unspecified: Secondary | ICD-10-CM | POA: Insufficient documentation

## 2017-06-28 ENCOUNTER — Telehealth (HOSPITAL_COMMUNITY): Payer: Self-pay

## 2017-08-23 ENCOUNTER — Ambulatory Visit (HOSPITAL_COMMUNITY): Payer: Self-pay | Admitting: Psychiatry

## 2017-10-04 ENCOUNTER — Ambulatory Visit (HOSPITAL_COMMUNITY): Payer: Self-pay | Admitting: Psychiatry

## 2017-11-17 ENCOUNTER — Ambulatory Visit (HOSPITAL_COMMUNITY): Payer: Self-pay | Admitting: Psychiatry

## 2017-11-22 ENCOUNTER — Ambulatory Visit (HOSPITAL_COMMUNITY): Payer: Self-pay | Admitting: Psychiatry

## 2018-01-12 ENCOUNTER — Ambulatory Visit (HOSPITAL_COMMUNITY): Payer: Self-pay | Admitting: Psychiatry

## 2018-02-17 ENCOUNTER — Ambulatory Visit (HOSPITAL_COMMUNITY): Payer: Medicare HMO | Admitting: Psychiatry

## 2018-02-17 DIAGNOSIS — G5139 Clonic hemifacial spasm, unspecified: Secondary | ICD-10-CM | POA: Insufficient documentation

## 2018-02-17 DIAGNOSIS — I771 Stricture of artery: Secondary | ICD-10-CM | POA: Insufficient documentation

## 2018-02-17 DIAGNOSIS — G514 Facial myokymia: Secondary | ICD-10-CM | POA: Insufficient documentation

## 2018-04-12 ENCOUNTER — Encounter (HOSPITAL_COMMUNITY): Payer: Self-pay | Admitting: Psychiatry

## 2018-04-12 ENCOUNTER — Ambulatory Visit (INDEPENDENT_AMBULATORY_CARE_PROVIDER_SITE_OTHER): Payer: Medicare Other | Admitting: Psychiatry

## 2018-04-12 VITALS — BP 130/70 | HR 64 | Ht 60.75 in | Wt 125.8 lb

## 2018-04-12 DIAGNOSIS — F3162 Bipolar disorder, current episode mixed, moderate: Secondary | ICD-10-CM

## 2018-04-12 DIAGNOSIS — H9192 Unspecified hearing loss, left ear: Secondary | ICD-10-CM | POA: Insufficient documentation

## 2018-04-12 MED ORDER — LAMOTRIGINE 25 MG PO TABS: ORAL_TABLET | ORAL | 0 refills | Status: DC
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
## 2018-05-24 ENCOUNTER — Ambulatory Visit (HOSPITAL_COMMUNITY): Payer: Self-pay | Admitting: Psychiatry

## 2018-06-21 ENCOUNTER — Ambulatory Visit (HOSPITAL_COMMUNITY): Payer: Medicare Other | Admitting: Psychiatry

## 2018-06-21 ENCOUNTER — Encounter (HOSPITAL_COMMUNITY): Payer: Self-pay | Admitting: Psychiatry

## 2018-06-21 VITALS — BP 122/68 | HR 64 | Ht 61.0 in | Wt 122.0 lb

## 2018-06-21 DIAGNOSIS — Z79899 Other long term (current) drug therapy: Secondary | ICD-10-CM

## 2018-06-21 DIAGNOSIS — F4321 Adjustment disorder with depressed mood: Secondary | ICD-10-CM

## 2018-06-21 DIAGNOSIS — F3181 Bipolar II disorder: Secondary | ICD-10-CM | POA: Diagnosis not present

## 2018-06-21 DIAGNOSIS — G5139 Clonic hemifacial spasm, unspecified: Secondary | ICD-10-CM | POA: Diagnosis not present

## 2018-06-21 MED ORDER — SERTRALINE HCL 100 MG PO TABS: 100.0000 mg | ORAL_TABLET | Freq: Every day | ORAL | 2 refills | Status: DC

## 2018-06-21 MED ORDER — DIVALPROEX SODIUM 250 MG PO DR TAB: DELAYED_RELEASE_TABLET | ORAL | 2 refills | Status: DC
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
## 2018-06-27 ENCOUNTER — Ambulatory Visit (HOSPITAL_COMMUNITY): Payer: Self-pay | Admitting: Psychiatry

## 2018-09-14 ENCOUNTER — Ambulatory Visit (INDEPENDENT_AMBULATORY_CARE_PROVIDER_SITE_OTHER): Payer: Medicare Other | Admitting: Psychiatry

## 2018-09-14 ENCOUNTER — Encounter (HOSPITAL_COMMUNITY): Payer: Self-pay | Admitting: Psychiatry

## 2018-09-14 DIAGNOSIS — Z5181 Encounter for therapeutic drug level monitoring: Secondary | ICD-10-CM | POA: Diagnosis not present

## 2018-09-14 DIAGNOSIS — F3181 Bipolar II disorder: Secondary | ICD-10-CM

## 2018-09-14 MED ORDER — DIVALPROEX SODIUM 500 MG PO DR TAB: 500.0000 mg | DELAYED_RELEASE_TABLET | Freq: Every day | ORAL | 0 refills | Status: DC

## 2018-09-14 MED ORDER — DIVALPROEX SODIUM 500 MG PO DR TAB: 500.0000 mg | DELAYED_RELEASE_TABLET | Freq: Every day | ORAL | 2 refills | Status: DC

## 2018-09-14 MED ORDER — SERTRALINE HCL 100 MG PO TABS: 100.0000 mg | ORAL_TABLET | Freq: Every day | ORAL | 2 refills | Status: DC

## 2018-09-14 MED ORDER — SERTRALINE HCL 100 MG PO TABS: 100.0000 mg | ORAL_TABLET | Freq: Every day | ORAL | 0 refills | Status: DC
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
## 2018-10-30 ENCOUNTER — Ambulatory Visit (HOSPITAL_COMMUNITY): Payer: Medicare Other | Admitting: Psychiatry

## 2018-10-30 ENCOUNTER — Ambulatory Visit (INDEPENDENT_AMBULATORY_CARE_PROVIDER_SITE_OTHER): Payer: Medicare Other | Admitting: Licensed Clinical Social Worker

## 2018-10-30 ENCOUNTER — Ambulatory Visit (HOSPITAL_COMMUNITY): Payer: Medicare Other | Admitting: Licensed Clinical Social Worker

## 2018-10-30 DIAGNOSIS — F3181 Bipolar II disorder: Secondary | ICD-10-CM
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
## 2018-11-08 DIAGNOSIS — H04123 Dry eye syndrome of bilateral lacrimal glands: Secondary | ICD-10-CM | POA: Insufficient documentation

## 2018-11-08 DIAGNOSIS — H40023 Open angle with borderline findings, high risk, bilateral: Secondary | ICD-10-CM | POA: Insufficient documentation

## 2018-11-16 NOTE — Progress Notes (Deleted)
Psychiatric Initial Adult Assessment   Patient Identification: Zoe Kennedy MRN:  [id number] Date of Evaluation:  11/16/2018 Referral Source: *** Chief Complaint:   Visit Diagnosis: No diagnosis found.  History of Present Illness:   Zoe Kennedy is a 78 y.o. year old female with a history of bipolar disorder, Sjogrens syndrome, hypothyroidism, diabetes, who is referred for bipolar disorder.    Associated Signs/Symptoms: Depression Symptoms:  {DEPRESSION SYMPTOMS:20000} (Hypo) Manic Symptoms:  {BHH MANIC SYMPTOMS:22872} Anxiety Symptoms:  {BHH ANXIETY SYMPTOMS:22873} Psychotic Symptoms:  {BHH PSYCHOTIC SYMPTOMS:22874} PTSD Symptoms: {BHH PTSD SYMPTOMS:22875}  Past Psychiatric History: ***  Previous Psychotropic Medications: {YES/NO:21197}  Substance Abuse History in the last 12 months:  {yes no:314532}  Consequences of Substance Abuse: {BHH CONSEQUENCES OF SUBSTANCE ABUSE:22880}  Past Medical History: No past medical history on file. No past surgical history on file.  Family Psychiatric History: ***  Family History: No family history on file.  Social History:   Social History   Socioeconomic History  . Marital status: Married    Spouse name: Not on file  . Number of children: Not on file  . Years of education: Not on file  . Highest education level: Not on file  Occupational History  . Not on file  Social Needs  . Financial resource strain: Not on file  . Food insecurity:    Worry: Not on file    Inability: Not on file  . Transportation needs:    Medical: Not on file    Non-medical: Not on file  Tobacco Use  . Smoking status: Never Smoker  . Smokeless tobacco: Never Used  Substance and Sexual Activity  . Alcohol use: No  . Drug use: No  . Sexual activity: Not Currently  Lifestyle  . Physical activity:    Days per week: Not on file    Minutes per session: Not on file  . Stress: Not on file  Relationships  . Social connections:    Talks on phone: Not on  file    Gets together: Not on file    Attends religious service: Not on file    Active member of club or organization: Not on file    Attends meetings of clubs or organizations: Not on file    Relationship status: Not on file  Other Topics Concern  . Not on file  Social History Narrative  . Not on file    Additional Social History: ***  Allergies:  No Known Allergies  Metabolic Disorder Labs: Lab Results  Component Value Date   HGBA1C 8.3 (H) 11/09/2007   No results found for: PROLACTIN Lab Results  Component Value Date   CHOL 324 (HH) 11/09/2007   TRIG 244 (HH) 11/09/2007   HDL 39.7 11/09/2007   CHOLHDL 8.2 CALC 11/09/2007   VLDL 49 (H) 11/09/2007   Lab Results  Component Value Date   TSH 1.15 08/23/2007    Therapeutic Level Labs: No results found for: LITHIUM No results found for: CBMZ No results found for: VALPROATE  Current Medications: Current Outpatient Medications  Medication Sig Dispense Refill  . alendronate (FOSAMAX) 70 MG tablet Take 70 mg by mouth every 7 (seven) days.    Marland Kitchen amLODipine (NORVASC) 10 MG tablet Take by mouth.    Marland Kitchen aspirin EC 81 MG tablet Take by mouth.    Marland Kitchen atorvastatin (LIPITOR) 40 MG tablet Take 40 mg by mouth at bedtime.    . chlorhexidine (PERIDEX) 0.12 % solution     . divalproex (DEPAKOTE) 500 MG DR  tablet Take 1 tablet (500 mg total) by mouth at bedtime. 90 tablet 0  . erythromycin ophthalmic ointment Place into both eyes nightly.    . fenofibrate 160 MG tablet Take 160 mg by mouth daily.    Marland Kitchen guaiFENesin-Codeine 200-10 MG/5ML LIQD Take by mouth.    . Influenza vac split quadrivalent PF (FLUZONE HIGH-DOSE) 0.5 ML injection     . insulin aspart (NOVOLOG) 100 UNIT/ML injection Inject into the skin.    Marland Kitchen insulin detemir (LEVEMIR) 100 UNIT/ML injection Inject into the skin.    . Insulin Pen Needle (ADVOCATE INSULIN PEN NEEDLES) 31G X 5 MM MISC by Does not apply route.    . Insulin Pen Needle (FIFTY50 PEN NEEDLES) 31G X 5 MM MISC by  Miscellaneous route Three (3) times a day with a meal.    . Insulin Syringes, Disposable, (KMART VALU INS SYR .3CC/30G) U-100 0.3 ML MISC by Does not apply route.    . irbesartan-hydrochlorothiazide (AVALIDE) 150-12.5 MG tablet Take 150 tablets by mouth daily.    Marland Kitchen levothyroxine (SYNTHROID, LEVOTHROID) 50 MCG tablet     . losartan (COZAAR) 50 MG tablet     . Multiple Vitamin (MULTI-VITAMINS) TABS Take by mouth.    . neomycin-polymyxin-hydrocortisone (CORTISPORIN) 3.5-10000-1 OTIC suspension   1  . ONE TOUCH ULTRA TEST test strip     . pravastatin (PRAVACHOL) 40 MG tablet     . sertraline (ZOLOFT) 100 MG tablet Take 1 tablet (100 mg total) by mouth daily. 135 tablet 0   No current facility-administered medications for this visit.     Musculoskeletal: Strength & Muscle Tone: within normal limits Gait & Station: normal Patient leans: N/A  Psychiatric Specialty Exam: ROS  There were no vitals taken for this visit.There is no height or weight on file to calculate BMI.  General Appearance: Fairly Groomed  Eye Contact:  Good  Speech:  Clear and Coherent  Volume:  Normal  Mood:  {BHH MOOD:22306}  Affect:  {Affect (PAA):22687}  Thought Process:  Coherent  Orientation:  Full (Time, Place, and Person)  Thought Content:  Logical  Suicidal Thoughts:  {ST/HT (PAA):22692}  Homicidal Thoughts:  {ST/HT (PAA):22692}  Memory:  Immediate;   Good  Judgement:  {Judgement (PAA):22694}  Insight:  {Insight (PAA):22695}  Psychomotor Activity:  Normal  Concentration:  Concentration: Good and Attention Span: Good  Recall:  Good  Fund of Knowledge:Good  Language: Good  Akathisia:  No  Handed:  Right  AIMS (if indicated):  not done  Assets:  Communication Skills Desire for Improvement  ADL's:  Intact  Cognition: WNL  Sleep:  {BHH GOOD/FAIR/POOR:22877}   Screenings:   Assessment and Plan:  Assessment  Plan  The patient demonstrates the following risk factors for suicide: Chronic risk  factors for suicide include: {Chronic Risk Factors for OQHUTML:46503546}. Acute risk factors for suicide include: {Acute Risk Factors for FKCLEXN:17001749}. Protective factors for this patient include: {Protective Factors for Suicide SWHQ:75916384}. Considering these factors, the overall suicide risk at this point appears to be {Desc; low/moderate/high:110033}. Patient {ACTION; IS/IS YKZ:99357017} appropriate for outpatient follow up.    Norman Clay, MD 1/23/20209:48 PM

## 2018-11-18 ENCOUNTER — Ambulatory Visit (INDEPENDENT_AMBULATORY_CARE_PROVIDER_SITE_OTHER): Payer: Medicare Other | Admitting: Psychiatry

## 2018-11-18 ENCOUNTER — Other Ambulatory Visit: Payer: Self-pay

## 2018-11-18 ENCOUNTER — Encounter (HOSPITAL_COMMUNITY): Payer: Self-pay | Admitting: Psychiatry

## 2018-11-18 DIAGNOSIS — F3181 Bipolar II disorder: Secondary | ICD-10-CM

## 2018-11-18 MED ORDER — VALPROIC ACID 250 MG PO CAPS: 500.0000 mg | ORAL_CAPSULE | Freq: Every day | ORAL | 1 refills | Status: DC

## 2018-11-18 MED ORDER — SERTRALINE HCL 100 MG PO TABS: 100.0000 mg | ORAL_TABLET | Freq: Every day | ORAL | 0 refills | Status: DC

## 2018-11-18 NOTE — Progress Notes (Signed)
BH MD/PA/NP OP Progress Note  11/18/2018 1:48 PM Zoe Kennedy  MRN:  [id number]  Chief Complaint:  Chief Complaint    Follow-up; Other; Depression; Medication Refill     HPI:  Zoe Kennedy is a 78 y.o. year old female with a history of bipolar disorder, Sjogrens syndrome, hypothyroidism, diabetes, who presents for follow-up appointment for bipolar disorder.  Per chart review, she was seen by Dr. Doyne Keel in November.  Depakote was uptitrated.   Patient states that she has been feeling depressed.  She misses her husband, who she was married for 43 years. He deceased in 31-May-2018.  It has been difficult for the patient to go through the holidays without him.  She feels that she has nobody, although she does report some support from people at church. She invited them on holidays. She lives by herself, no children.  She feels depressed.  She has fair concentration.  She is a little more motivated.  She felt SI last month, although she denies intent or plans.  She denies any recent SI and also states that she is [place]. She feels anxious at times.  She denies decreased need for sleep or euphoria.  She has not increased Depakote to 500 mg. She did not do it as she thought that it needs to be gradually uptitrated. (she ran out of medication a few days ago) Provided psychoeducation of uptitration of Depakote. She has not done blood test at her last visit which was directed per chart. She denies flashback, nightmares.   Past trials of medication: lithium- thyroid issues, Abilify- muscle twitch  Wt Readings from Last 3 Encounters:  11/18/18 130 lb 6.4 oz (59.1 kg)  06/21/18 122 lb (55.3 kg)  04/12/18 125 lb 12.8 oz (57.1 kg)    Visit Diagnosis:    ICD-10-CM   1. 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 I have reviewed the history. No updates at this time.   Patient denies any history of psychiatric inpatient treatment but endorsed history of bipolar disorder diagnosed in 38.  She was given Zoloft when she was in [place] and then lithium which helped her a lot but also because thyroid problems.  She continued Zoloft when she moved to [place] after her husband retired.  She saw Dr. Laurance Flatten and prescribed Lamictal which worked very well for her but she was noncompliant with follow-ups.  Patient denies any history of suicidal attempt but reported history of mania, grandiosity, extreme irritability and mood swings.  Recently she was given Abilify which worked but also caused muscle twitching.  Past Medical History:  Past Medical History:  Diagnosis Date  . Depression   . Hypertelorism    History reviewed. No pertinent surgical history.  Family Psychiatric History: Please see initial evaluation for full details. I have reviewed the history. No updates at this time.     Family History: History reviewed. No pertinent family history.  Social History:  Social History   Socioeconomic History  . Marital status: Widowed    Spouse name: Not on file  . Number of children: 0  . Years of education: Not on file  . Highest education level: Not on file  Occupational History  . Not on file  Social Needs  . Financial resource strain: Not very hard  .  Food insecurity:    Worry: Sometimes true    Inability: Sometimes true  . Transportation needs:    Medical: No    Non-medical: No  Tobacco Use  . Smoking status: Never Smoker  . Smokeless tobacco: Never Used  Substance and Sexual Activity  . Alcohol use: No  . Drug use: No  . Sexual activity: Not Currently  Lifestyle  . Physical activity:    Days per week: 3 days    Minutes per session: 30 min  . Stress: Not on file  Relationships  . Social connections:    Talks on phone: Not on file    Gets together: Not on file     Attends religious service: Not on file    Active member of club or organization: No    Attends meetings of clubs or organizations: Never    Relationship status: Widowed  Other Topics Concern  . Not on file  Social History Narrative  . Not on file    Allergies: No Known Allergies  Metabolic Disorder Labs: Lab Results  Component Value Date   HGBA1C 8.3 (H) 11/09/2007   No results found for: PROLACTIN Lab Results  Component Value Date   CHOL 324 (HH) 11/09/2007   TRIG 244 (HH) 11/09/2007   HDL 39.7 11/09/2007   CHOLHDL 8.2 CALC 11/09/2007   VLDL 49 (H) 11/09/2007   Lab Results  Component Value Date   TSH 1.15 08/23/2007   TSH 0.82 03/24/2007    Therapeutic Level Labs: No results found for: LITHIUM No results found for: VALPROATE No components found for:  CBMZ  Current Medications: Current Outpatient Medications  Medication Sig Dispense Refill  . alendronate (FOSAMAX) 70 MG tablet Take 70 mg by mouth every 7 (seven) days.    Marland Kitchen amLODipine (NORVASC) 10 MG tablet Take by mouth.    Marland Kitchen aspirin EC 81 MG tablet Take by mouth.    Marland Kitchen atorvastatin (LIPITOR) 40 MG tablet Take 40 mg by mouth at bedtime.    . divalproex (DEPAKOTE) 500 MG DR tablet Take 1 tablet (500 mg total) by mouth at bedtime. 90 tablet 0  . erythromycin ophthalmic ointment Place into both eyes nightly.    . fenofibrate 160 MG tablet Take 160 mg by mouth daily.    Marland Kitchen guaiFENesin-Codeine 200-10 MG/5ML LIQD Take by mouth.    Marland Kitchen HUMALOG 100 UNIT/ML injection INJECT 20 UNITS SUBCUTANEOUSLY THREE TIMES DAILY BEFORE MEAL(S) AS DIRECTED    . Influenza vac split quadrivalent PF (FLUZONE HIGH-DOSE) 0.5 ML injection     . insulin aspart (NOVOLOG) 100 UNIT/ML injection Inject into the skin.    Marland Kitchen insulin detemir (LEVEMIR) 100 UNIT/ML injection Inject into the skin.    . Insulin Pen Needle (ADVOCATE INSULIN PEN NEEDLES) 31G X 5 MM MISC by Does not apply route.    . Insulin Pen Needle (FIFTY50 PEN NEEDLES) 31G X 5 MM MISC by  Miscellaneous route Three (3) times a day with a meal.    . Insulin Syringes, Disposable, (KMART VALU INS SYR .3CC/30G) U-100 0.3 ML MISC by Does not apply route.    . irbesartan-hydrochlorothiazide (AVALIDE) 150-12.5 MG tablet Take 150 tablets by mouth daily.    Marland Kitchen levothyroxine (SYNTHROID, LEVOTHROID) 50 MCG tablet     . losartan (COZAAR) 50 MG tablet     . meloxicam (MOBIC) 15 MG tablet Take by mouth.    . Multiple Vitamin (MULTI-VITAMINS) TABS Take by mouth.    . neomycin-polymyxin-hydrocortisone (CORTISPORIN) 3.5-10000-1 OTIC suspension  1  . ONE TOUCH ULTRA TEST test strip     . pravastatin (PRAVACHOL) 40 MG tablet     . sertraline (ZOLOFT) 100 MG tablet Take 1 tablet (100 mg total) by mouth daily. 90 tablet 0  . valproic acid (DEPAKENE) 250 MG capsule Take 2 capsules (500 mg total) by mouth at bedtime. 60 capsule 1   No current facility-administered medications for this visit.      Musculoskeletal: Strength & Muscle Tone: within normal limits Gait & Station: normal Patient leans: N/A  Psychiatric Specialty Exam: Review of Systems  Psychiatric/Behavioral: Positive for depression. Negative for hallucinations, memory loss, substance abuse and suicidal ideas. The patient is nervous/anxious and has insomnia.   All other systems reviewed and are negative.   Blood pressure (!) 171/85, pulse 69, temperature 99.1 F (37.3 C), temperature source Oral, weight 130 lb 6.4 oz (59.1 kg).Body mass index is 24.64 kg/m.  General Appearance: Fairly Groomed  Eye Contact:  Good  Speech:  Clear and Coherent  Volume:  Normal  Mood:  Depressed  Affect:  Appropriate, Congruent and slightly restricted  Thought Process:  Coherent  Orientation:  Full (Time, Place, and Person)  Thought Content: Logical   Suicidal Thoughts:  No  Homicidal Thoughts:  No  Memory:  Immediate;   Good  Judgement:  Good  Insight:  Fair  Psychomotor Activity:  Normal  Concentration:  Concentration: Good and Attention  Span: Good  Recall:  Good  Fund of Knowledge: Good  Language: Good  Akathisia:  No  Handed:  Right  AIMS (if indicated): not done  Assets:  Communication Skills Desire for Improvement  ADL's:  Intact  Cognition: WNL  Sleep:  Fair   Screenings:   Assessment and Plan:  Zoe Kennedy is a 78 y.o. year old female with a history of bipolar II disorder, Sjogrens syndrome, hypothyroidism, diabetes, who presents for follow-up appointment for bipolar disorder.   # Bipolar II disorder Patient continues to report depressive symptoms in the context of loss of her husband in July.  Noted that she has not uptitrate Depakote, which was directed at her last visit.  Will do up titration of Depakote to target mood dysregulation.  Will obtain labs (she prefers it to be done at family practice) to monitor levels.  Will continue sertraline to target depression.    # non adherence She has been non adherence to advice from the last visit, and it may be attributable to underlying cognitive deficits. Will consider MOCA at the next visit if that is applicable.   Plan 1. Continue sertraline 100 mg daily  2. Increase depakote 500 mg at night  3. Obtain blood test, five days after taking depakote (vpa, cmp) 3. Return to clinic in one month   The patient demonstrates the following risk factors for suicide: Chronic risk factors for suicide include: psychiatric disorder of bipolar disorder. Acute risk factors for suicide include: loss (financial, interpersonal, professional). Protective factors for this patient include: hope for the future. Considering these factors, the overall suicide risk at this point appears to be low. Patient is appropriate for outpatient follow up.  The duration of this appointment visit was 25 minutes of face-to-face time with the patient.  Greater than 50% of this time was spent in counseling, explanation of  diagnosis, planning of further management, and coordination of care.  Norman Clay, MD 11/18/2018, 1:48 PM

## 2018-11-18 NOTE — Patient Instructions (Signed)
1. Continue sertraline 100 mg daily  2. Increase depakote 500 mg at night  3. Obtain blood test, five days after taking depakote  3. Return to clinic in one month

## 2018-11-28 ENCOUNTER — Other Ambulatory Visit (HOSPITAL_COMMUNITY): Payer: Self-pay

## 2018-11-28 DIAGNOSIS — Z5181 Encounter for therapeutic drug level monitoring: Secondary | ICD-10-CM

## 2018-11-28 DIAGNOSIS — Z79899 Other long term (current) drug therapy: Secondary | ICD-10-CM

## 2018-12-02 LAB — COMPREHENSIVE METABOLIC PANEL
ALT: 20 IU/L (ref 0–32)
AST: 26 IU/L (ref 0–40)
Albumin/Globulin Ratio: 2.3 — ABNORMAL HIGH (ref 1.2–2.2)
Albumin: 4.9 g/dL — ABNORMAL HIGH (ref 3.7–4.7)
Alkaline Phosphatase: 49 IU/L (ref 39–117)
BUN/Creatinine Ratio: 19 (ref 12–28)
BUN: 14 mg/dL (ref 8–27)
Bilirubin Total: 0.3 mg/dL (ref 0.0–1.2)
CO2: 21 mmol/L (ref 20–29)
Calcium: 10 mg/dL (ref 8.7–10.3)
Chloride: 98 mmol/L (ref 96–106)
Creatinine, Ser: 0.72 mg/dL (ref 0.57–1.00)
GFR calc Af Amer: 95 mL/min/{1.73_m2} (ref >59)
GFR calc non Af Amer: 82 mL/min/{1.73_m2} (ref >59)
Globulin, Total: 2.1 g/dL (ref 1.5–4.5)
Glucose: 229 mg/dL — ABNORMAL HIGH (ref 65–99)
Potassium: 5 mmol/L (ref 3.5–5.2)
Sodium: 138 mmol/L (ref 134–144)
Total Protein: 7 g/dL (ref 6.0–8.5)

## 2018-12-02 LAB — VALPROIC ACID LEVEL: Valproic Acid Lvl: 28 ug/mL — ABNORMAL LOW (ref 50–100)

## 2018-12-04 ENCOUNTER — Ambulatory Visit (HOSPITAL_COMMUNITY): Payer: Medicare Other | Admitting: Licensed Clinical Social Worker

## 2018-12-04 ENCOUNTER — Telehealth (HOSPITAL_COMMUNITY): Payer: Self-pay | Admitting: Psychiatry

## 2018-12-04 NOTE — Telephone Encounter (Signed)
Could you contact the patient (I saw her on Saturday clinic). Reviewed labs, which are acceptable level except glucose. I would recommend she continue current dose of depakote. Advise her to continue to follow with her primary care for diabetes.

## 2018-12-06 NOTE — Telephone Encounter (Signed)
I called patient and lvm letting her know to continue her current dose of Depakote. I also advised patient to follow up with PCP about her diabetes medication to be sure it does not need to be adjusted. Call back number left for the patient if she has any questions

## 2018-12-21 ENCOUNTER — Encounter (HOSPITAL_COMMUNITY): Payer: Self-pay | Admitting: Psychiatry

## 2018-12-21 ENCOUNTER — Ambulatory Visit (INDEPENDENT_AMBULATORY_CARE_PROVIDER_SITE_OTHER): Payer: Medicare Other | Admitting: Psychiatry

## 2018-12-21 VITALS — BP 140/70 | HR 63 | Ht 60.75 in | Wt 131.0 lb

## 2018-12-21 DIAGNOSIS — F3181 Bipolar II disorder: Secondary | ICD-10-CM

## 2018-12-21 MED ORDER — SERTRALINE HCL 50 MG PO TABS: 50.0000 mg | ORAL_TABLET | Freq: Every day | ORAL | 2 refills | Status: DC

## 2018-12-21 MED ORDER — VALPROIC ACID 250 MG PO CAPS: 500.0000 mg | ORAL_CAPSULE | Freq: Every day | ORAL | 3 refills | Status: DC

## 2018-12-21 NOTE — Progress Notes (Signed)
[facility] MD/PA/NP OP Progress Note  12/21/2018 1:33 PM Zoe Kennedy  MRN:  [id number]  Chief Complaint:  Chief Complaint    Depression; Follow-up     HPI: Patient reports that her depression has significantly improved.  She believes that the main reason she was feeling depressed was due to her ongoing anger and irritability.  She states that she comes from a family where that was very common.  She was feeling disrespected by several members of the family and in the community which was causing her to feel irritable.  She could not respond to these individuals and therefore had no way of releasing all of her pent-up feelings.  The Depakote has helped to significantly calm her irritability which in turn has helped to improve her depression.  She is sleeping well.  And she is active in some social areas.  She is denying SI and states that her religious beliefs prevent suicide.  She denies HI.  She is denying any manic or hypomanic-like symptoms.  Tells me that she has been on Zoloft for many years.  Recently she began experiencing a twitch or tic in her cheek that also went up to her eye.  She went to her neurologist who ruled out any obvious problems.  So patient decided to decrease the dose of Zoloft on her own.  She has been taking 50 mg and reported that the twitching and tics have resolved.      Visit Diagnosis:    ICD-10-CM   1. 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  She was given Zoloft when she was in [place] and then lithium which helped her a lot but also because thyroid problems.  She continued Zoloft when she moved to [place] after her husband retired.  She saw Dr. Laurance Flatten and prescribed Lamictal which worked very well for  her but she was noncompliant with follow-ups.  Patient denies any history of suicidal attempt but reported history of mania, grandiosity, extreme irritability and mood swings.  Recently she was given Abilify which worked but also caused muscle twitching.On lithium 30 years ago, ruined thyroid.    Past Medical History: diabetes mellitus, hypothyroidism, Sjogren syndrome, GERD, allergic rhinitis, hearing loss on her right eye; and muscle twitching.     Family Psychiatric History: nothing significant  Family History: No family history on file.  Social History:  Social History   Socioeconomic History  . Marital status: Widowed    Spouse name: Not on file  . Number of children: 0  . Years of education: Not on file  . Highest education level: Not on file  Occupational History  . Not on file  Social Needs  . Financial resource strain: Not very hard  . Food insecurity:    Worry: Sometimes true    Inability: Sometimes true  . Transportation needs:    Medical: No    Non-medical: No  Tobacco Use  . Smoking status: Never Smoker  . Smokeless tobacco: Never Used  Substance and Sexual Activity  . Alcohol use: No  . Drug use: No  . Sexual activity: Not Currently  Lifestyle  . Physical activity:    Days per week: 3 days    Minutes per session: 30 min  . Stress:  Not on file  Relationships  . Social connections:    Talks on phone: Not on file    Gets together: Not on file    Attends religious service: Not on file    Active member of club or organization: No    Attends meetings of clubs or organizations: Never    Relationship status: Widowed  Other Topics Concern  . Not on file  Social History Narrative  . Not on file    Allergies: No Known Allergies  Metabolic Disorder Labs: Lab Results  Component Value Date   HGBA1C 8.3 (H) 11/09/2007   No results found for: PROLACTIN Lab Results  Component Value Date   CHOL 324 (HH) 11/09/2007   TRIG 244 (HH) 11/09/2007   HDL 39.7  11/09/2007   CHOLHDL 8.2 CALC 11/09/2007   VLDL 49 (H) 11/09/2007   Lab Results  Component Value Date   TSH 1.15 08/23/2007   TSH 0.82 03/24/2007    Therapeutic Level Labs: No results found for: LITHIUM Lab Results  Component Value Date   VALPROATE 28 (L) 11/28/2018   No components found for:  CBMZ  Current Medications: Current Outpatient Medications  Medication Sig Dispense Refill  . amLODipine (NORVASC) 10 MG tablet Take by mouth.    Marland Kitchen aspirin EC 81 MG tablet Take by mouth.    Marland Kitchen atorvastatin (LIPITOR) 40 MG tablet Take 40 mg by mouth at bedtime.    Marland Kitchen erythromycin ophthalmic ointment Place into both eyes nightly.    Marland Kitchen HUMALOG 100 UNIT/ML injection INJECT 20 UNITS SUBCUTANEOUSLY THREE TIMES DAILY BEFORE MEAL(S) AS DIRECTED    . Influenza vac split quadrivalent PF (FLUZONE HIGH-DOSE) 0.5 ML injection     . insulin detemir (LEVEMIR) 100 UNIT/ML injection Inject into the skin.    . Insulin Pen Needle (ADVOCATE INSULIN PEN NEEDLES) 31G X 5 MM MISC by Does not apply route.    . Insulin Pen Needle (FIFTY50 PEN NEEDLES) 31G X 5 MM MISC by Miscellaneous route Three (3) times a day with a meal.    . Insulin Syringes, Disposable, (KMART VALU INS SYR .3CC/30G) U-100 0.3 ML MISC by Does not apply route.    . irbesartan-hydrochlorothiazide (AVALIDE) 150-12.5 MG tablet Take 150 tablets by mouth daily.    Marland Kitchen levothyroxine (SYNTHROID, LEVOTHROID) 50 MCG tablet     . Multiple Vitamin (MULTI-VITAMINS) TABS Take by mouth.    . ONE TOUCH ULTRA TEST test strip     . sertraline (ZOLOFT) 50 MG tablet Take 1 tablet (50 mg total) by mouth daily. 30 tablet 2  . valproic acid (DEPAKENE) 250 MG capsule Take 2 capsules (500 mg total) by mouth at bedtime. 60 capsule 3  . fenofibrate 160 MG tablet Take 160 mg by mouth daily.    . meloxicam (MOBIC) 15 MG tablet Take by mouth.     No current facility-administered medications for this visit.      Musculoskeletal: Strength & Muscle Tone: within normal  limits Gait & Station: normal Patient leans: N/A  Psychiatric Specialty Exam: Review of Systems  Constitutional: Negative for chills, diaphoresis and fever.  Respiratory: Negative for cough, sputum production and wheezing.     Blood pressure 140/70, pulse 63, height 5' 0.75" (1.543 m), weight 131 lb (59.4 kg), SpO2 95 %.Body mass index is 24.96 kg/m.  General Appearance: Fairly Groomed  Eye Contact:  Fair  Speech:  Normal Rate  Volume:  Normal  Mood:  Euthymic  Affect:  Full Range  Thought Process:  Goal Directed and Descriptions of Associations: Intact  Orientation:  Full (Time, Place, and Person)  Thought Content:  Logical  Suicidal Thoughts:  No  Homicidal Thoughts:  No  Memory:  Immediate;   Good  Judgement:  Good  Insight:  Good  Psychomotor Activity:  Normal  Concentration:  Concentration: Good  Recall:  Good  Fund of Knowledge:  Good  Language:  Good  Akathisia:  No  Handed:  Right  AIMS (if indicated):     Assets:  Communication Skills Desire for Improvement Housing Leisure Time Transportation Vocational/Educational  ADL's:  Intact  Cognition:  WNL  Sleep:        Screenings:    I reviewed the information below on 12/21/2018 and have updated it Assessment and Plan: Bipolar II disorder   Medication management with supportive therapy. Risks and benefits, side effects and alternative treatment options discussed with patient. Pt was given an opportunity to ask questions about medication, illness, and treatment. All current psychiatric medications have been reviewed and discussed with the patient and adjusted as clinically appropriate. The patient has been provided an accurate and updated list of the medications being now prescribed. Pt verbalized understanding and verbal consent obtained for treatment.  The risk of un-intended pregnancy is low based on the fact that pt reports she is post menopausal. Pt is aware that these meds carry a teratogenic risk. Pt will  discuss plan of action if she does or plans to become pregnant in the future.  Status of current problems: depression has improved  Meds: decrease Zoloft 50mg  po qD due to reported facial tics. Reports tics have improved since dose was decreased Depakote DR 500mg  po qHS for mood   Labs: reviewed labs done on 11/28/2018 Depakote level 28, CMP shows AST and ALT and Na wnl.   Therapy: brief supportive therapy provided. Discussed psychosocial stressors in detail.    Consultations: pt declined therapy    Pt's chronic risk factors for suicide are hx of depression, hx of noncompliance with meds and follow up. Pt's protective factors are denying hx of previous suicide attempts.  She reports that her suicidal ideations have not occurred for more than 1 month.  Pt is at an acute low risk for suicide. Patient told to call clinic if any problems occur. Patient advised to go to ER if they should develop SI/HI, side effects, or if symptoms worsen. Pt has crisis numbers to call if needed. Pt acknowledged and agreed with plan and verbalized understanding.  F/up in 3 months or sooner if needed  The duration of this appointment visit was 20 minutes of face-to-face time with the patient.  Greater than 50% of this time was spent in counseling, explanation of  diagnosis, planning of further management, and coordination of care     [name], MD 12/21/2018, 1:33 PM

## 2019-03-15 ENCOUNTER — Ambulatory Visit (INDEPENDENT_AMBULATORY_CARE_PROVIDER_SITE_OTHER): Payer: Medicare Other | Admitting: Psychiatry

## 2019-03-15 ENCOUNTER — Other Ambulatory Visit: Payer: Self-pay

## 2019-03-15 ENCOUNTER — Encounter (HOSPITAL_COMMUNITY): Payer: Self-pay | Admitting: Psychiatry

## 2019-03-15 DIAGNOSIS — F3181 Bipolar II disorder: Secondary | ICD-10-CM | POA: Diagnosis not present

## 2019-03-15 MED ORDER — SERTRALINE HCL 50 MG PO TABS: 50.0000 mg | ORAL_TABLET | Freq: Every day | ORAL | 2 refills | Status: DC

## 2019-03-15 MED ORDER — VALPROIC ACID 250 MG PO CAPS: 500.0000 mg | ORAL_CAPSULE | Freq: Every day | ORAL | 3 refills | Status: DC

## 2019-03-15 NOTE — Progress Notes (Signed)
BH MD/PA/NP OP Progress Note  03/15/2019 2:26 PM Zoe Kennedy  MRN:  [id number]  Chief Complaint:  Chief Complaint    Follow-up     HPI: Overall [name] is feeling better. Pt is less depressed and more at peace. Her mind is no longer racing. She is able to do chores and other things around the house.  Her motivation has improved anger and irritability have dramatically improved. For the last 2 weeks her sleep has improved and feels more restorative. Appetite is good and she is eating well. Pt denies manic and hypomanic like symptoms. She denies SI/HI. The twitch in her cheek going towards her eye is less frequent and less visible. She feels she looks normal again.  Patient was unable to go over her med list.  She states that she did not have her list of medications with her at the time and could not recall all of the names.  She states that she has an appointment with her primary care doctor tomorrow.    Visit Diagnosis:    ICD-10-CM   1. 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  She was given Zoloft when she was in [place] and then lithium which helped her a lot but also because thyroid problems.  She continued Zoloft when she moved to [place] after her husband retired.  She saw Dr. Laurance Flatten and prescribed Lamictal which worked very well for her but she was noncompliant with follow-ups.  Patient denies any history of suicidal attempt but reported history of mania, grandiosity, extreme irritability and mood swings.  Recently she was given Abilify which worked but also caused muscle twitching.On lithium 30 years ago, ruined thyroid.    Past Medical History: diabetes mellitus, hypothyroidism, Sjogren syndrome, GERD, allergic  rhinitis, hearing loss on her right eye; and muscle twitching.     Family Psychiatric History: nothing significant  Family History: History reviewed. No pertinent family history.  Social History:  Social History   Socioeconomic History  . Marital status: Widowed    Spouse name: Not on file  . Number of children: 0  . Years of education: Not on file  . Highest education level: Not on file  Occupational History  . Not on file  Social Needs  . Financial resource strain: Not very hard  . Food insecurity:    Worry: Sometimes true    Inability: Sometimes true  . Transportation needs:    Medical: No    Non-medical: No  Tobacco Use  . Smoking status: Never Smoker  . Smokeless tobacco: Never Used  Substance and Sexual Activity  . Alcohol use: No  . Drug use: No  . Sexual activity: Not Currently  Lifestyle  . Physical activity:    Days per week: 3 days    Minutes per session: 30 min  . Stress: Not on file  Relationships  . Social connections:    Talks on phone: Not on file    Gets together: Not on file    Attends religious service: Not on file    Active member of club or organization: No    Attends meetings of clubs or organizations: Never    Relationship status: Widowed  Other Topics Concern  . Not on file  Social History  Narrative  . Not on file    Allergies: No Known Allergies  Metabolic Disorder Labs: Lab Results  Component Value Date   HGBA1C 8.3 (H) 11/09/2007   No results found for: PROLACTIN Lab Results  Component Value Date   CHOL 324 (HH) 11/09/2007   TRIG 244 (HH) 11/09/2007   HDL 39.7 11/09/2007   CHOLHDL 8.2 CALC 11/09/2007   VLDL 49 (H) 11/09/2007   Lab Results  Component Value Date   TSH 1.15 08/23/2007   TSH 0.82 03/24/2007    Therapeutic Level Labs: No results found for: LITHIUM Lab Results  Component Value Date   VALPROATE 28 (L) 11/28/2018   No components found for:  CBMZ  Current Medications: Current Outpatient Medications   Medication Sig Dispense Refill  . sertraline (ZOLOFT) 50 MG tablet Take 1 tablet (50 mg total) by mouth daily. 30 tablet 2  . valproic acid (DEPAKENE) 250 MG capsule Take 2 capsules (500 mg total) by mouth at bedtime. 60 capsule 3  . amLODipine (NORVASC) 10 MG tablet Take by mouth.    Marland Kitchen aspirin EC 81 MG tablet Take by mouth.    Marland Kitchen atorvastatin (LIPITOR) 40 MG tablet Take 40 mg by mouth at bedtime.    Marland Kitchen erythromycin ophthalmic ointment Place into both eyes nightly.    . fenofibrate 160 MG tablet Take 160 mg by mouth daily.    Marland Kitchen HUMALOG 100 UNIT/ML injection INJECT 20 UNITS SUBCUTANEOUSLY THREE TIMES DAILY BEFORE MEAL(S) AS DIRECTED    . Influenza vac split quadrivalent PF (FLUZONE HIGH-DOSE) 0.5 ML injection     . insulin detemir (LEVEMIR) 100 UNIT/ML injection Inject into the skin.    . Insulin Pen Needle (ADVOCATE INSULIN PEN NEEDLES) 31G X 5 MM MISC by Does not apply route.    . Insulin Pen Needle (FIFTY50 PEN NEEDLES) 31G X 5 MM MISC by Miscellaneous route Three (3) times a day with a meal.    . Insulin Syringes, Disposable, (KMART VALU INS SYR .3CC/30G) U-100 0.3 ML MISC by Does not apply route.    . irbesartan-hydrochlorothiazide (AVALIDE) 150-12.5 MG tablet Take 150 tablets by mouth daily.    Marland Kitchen levothyroxine (SYNTHROID, LEVOTHROID) 50 MCG tablet     . meloxicam (MOBIC) 15 MG tablet Take by mouth.    . Multiple Vitamin (MULTI-VITAMINS) TABS Take by mouth.    . ONE TOUCH ULTRA TEST test strip      No current facility-administered medications for this visit.     MSE: Patient was pleasant and cooperative on the phone.  Her speech was of increased rate with normal tone and volume.  Due to the increased rate and accent at times it was difficult to understand her.  Her mood is euthymic and affect is full.  Thought processes are goal directed and intact.  Thought content is logical.  She denies SI/HI.  She denies AVH and did not appear to be responding to internal stimuli.  Memory and  concentration are on the poor side.  Use of language and fund of knowledge are average.  Insight and judgment are fair.  I am unable to comment on physical appearance, eye contact or psychomotor activity as patient was not visible to me.    Screenings:   I reviewed the information below on 03/15/2019 and have updated it Assessment and Plan: Bipolar II disorder   Medication management with supportive therapy. Risks and benefits, side effects and alternative treatment options discussed with patient. Pt was given an opportunity to ask  questions about medication, illness, and treatment. All current psychiatric medications have been reviewed and discussed with the patient and adjusted as clinically appropriate. The patient has been provided an accurate and updated list of the medications being now prescribed. Pt verbalized understanding and verbal consent obtained for treatment.  The risk of un-intended pregnancy is low based on the fact that pt reports she is post menopausal. Pt is aware that these meds carry a teratogenic risk. Pt will discuss plan of action if she does or plans to become pregnant in the future.  Status of current problems: stable and doing well  Meds:  Zoloft 50mg  po qD due to reported facial tics. Reports tics have improved since dose was decreased Depakote DR 500mg  po qHS for mood   Labs: None  Therapy: brief supportive therapy provided. Discussed psychosocial stressors in detail.    Consultations: pt declined therapy    Pt's chronic risk factors for suicide are hx of depression, hx of noncompliance with meds and follow up. Pt's protective factors are denying hx of previous suicide attempts and religious beliefs.  She is denying any SI.  pt is at an acute low risk for suicide. Patient told to call clinic if any problems occur. Patient advised to go to ER if they should develop SI/HI, side effects, or if symptoms worsen. Pt has crisis numbers to call if needed. Pt  acknowledged and agreed with plan and verbalized understanding.  F/up in 3 months or sooner if needed I provided 15 minutes of non-face-to-face time during this encounter     [name], MD 03/15/2019, 2:26 PM

## 2019-06-14 ENCOUNTER — Ambulatory Visit (INDEPENDENT_AMBULATORY_CARE_PROVIDER_SITE_OTHER): Payer: Medicare Other | Admitting: Psychiatry

## 2019-06-14 ENCOUNTER — Other Ambulatory Visit: Payer: Self-pay

## 2019-06-14 ENCOUNTER — Encounter (HOSPITAL_COMMUNITY): Payer: Self-pay | Admitting: Psychiatry

## 2019-06-14 DIAGNOSIS — F3181 Bipolar II disorder: Secondary | ICD-10-CM | POA: Diagnosis not present

## 2019-06-14 DIAGNOSIS — Z5181 Encounter for therapeutic drug level monitoring: Secondary | ICD-10-CM

## 2019-06-14 MED ORDER — SERTRALINE HCL 50 MG PO TABS: 50.0000 mg | ORAL_TABLET | Freq: Every day | ORAL | 2 refills | Status: DC

## 2019-06-14 MED ORDER — VALPROIC ACID 250 MG PO CAPS: 500.0000 mg | ORAL_CAPSULE | Freq: Every day | ORAL | 3 refills | Status: DC

## 2019-06-14 NOTE — Progress Notes (Signed)
Virtual Visit via Telephone Note  I connected with Zoe Kennedy on 06/14/19 at  2:00 PM EDT by telephone and verified that I am speaking with the correct person using two identifiers.  Location: Patient: home Provider: office   I discussed the limitations, risks, security and privacy concerns of performing an evaluation and management service by telephone and the availability of in person appointments. I also discussed with the patient that there may be a patient responsible charge related to this service. The patient expressed understanding and agreed to proceed.   History of Present Illness: Pt states she is doing well. She denies any symptoms of depression. She denies any manic and hypomanic symptoms. Shrinidhi takes the Depakote in the afternoon. If she takes it at night it keeps her up. Most nights she sleeps about 6 hrs/night. Pt denies SI/HI. She has been stressed about problems with her home and other than that she is doing well.    Observations/Objective: I spoke with Zoe Kennedy on the phone.  Pt was cooperative.  Pt was engaged in the conversation and answered questions appropriately.  Speech was clear and coherent with normal rate, tone and volume.  Mood is euthymic and affect is anxious. Thought processes are coherent and circumstantial.  Thought content is with ruminations.  Pt denies SI/HI.   Pt denies auditory and visual hallucinations and did not appear to be responding to internal stimuli.  Memory and concentration are good.  Fund of knowledge and use of language are average.  Insight and judgment are fair.  I am unable to comment on psychomotor activity, general appearance, hygiene, or eye contact as I was unable to physically see the patient on the phone.  Vital signs not available since interview conducted virtually.    Assessment and Plan: Bipolar II d/o  Zoloft 50mg  po qD- increased dose caused facial tics  Depakote DR 500mg  po qHS for Bipolar d/o  Labs: ordered Depakote  level, CBC (platelets), CMP and ammonia level  Follow Up Instructions: In 3 months or sooner if needed   I discussed the assessment and treatment plan with the patient. The patient was provided an opportunity to ask questions and all were answered. The patient agreed with the plan and demonstrated an understanding of the instructions.   The patient was advised to call back or seek an in-person evaluation if the symptoms worsen or if the condition fails to improve as anticipated.  I provided 15 minutes of non-face-to-face time during this encounter.   [name], MD

## 2019-07-26 ENCOUNTER — Ambulatory Visit (INDEPENDENT_AMBULATORY_CARE_PROVIDER_SITE_OTHER): Payer: Medicare Other | Admitting: Psychiatry

## 2019-07-26 ENCOUNTER — Other Ambulatory Visit: Payer: Self-pay

## 2019-07-26 ENCOUNTER — Encounter (HOSPITAL_COMMUNITY): Payer: Self-pay | Admitting: Psychiatry

## 2019-07-26 DIAGNOSIS — F3181 Bipolar II disorder: Secondary | ICD-10-CM

## 2019-07-26 DIAGNOSIS — F4322 Adjustment disorder with anxiety: Secondary | ICD-10-CM

## 2019-07-26 MED ORDER — ESCITALOPRAM OXALATE 10 MG PO TABS: 10.0000 mg | ORAL_TABLET | Freq: Every day | ORAL | 1 refills | Status: DC

## 2019-07-26 NOTE — Progress Notes (Signed)
  Virtual Visit via Telephone Note  I connected with Zoe Kennedy  on 07/26/19 at 11:00 AM EDT by telephone and verified that I am speaking with the correct person using two identifiers.  Location: Patient: home Provider: office   I discussed the limitations, risks, security and privacy concerns of performing an evaluation and management service by telephone and the availability of in person appointments. I also discussed with the patient that there may be a patient responsible charge related to this service. The patient expressed understanding and agreed to proceed.   History of Present Illness: Pt states last week she started having a lot of anxiety for unknown reasons. Most of her thoughts center on being alone. She feels no one cares about her and that no one wants her around. She has no one to take care of her as her husband died last year and she has no children. Every little thing that people say cause her to think they don't want her anymore. It makes her sad but she is denying depression. She denies SI/HI. She denies any manic or hypomanic like symptoms.      Observations/Objective: I spoke with Zoe Kennedy on the phone.  Pt was calm, pleasant and cooperative.  Pt was engaged in the conversation and answered questions appropriately.  Speech was clear and coherent with normal rate, tone and volume.  Mood is  anxious, affect is congruent. Thought processes are coherent, goal oriented and intact.  Thought content is with ruminations.  Pt denies SI/HI.   Pt denies auditory and visual hallucinations and did not appear to be responding to internal stimuli.  Memory and concentration are good.  Fund of knowledge and use of language are average.  Insight and judgment are fair.  I am unable to comment on psychomotor activity, general appearance, hygiene, or eye contact as I was unable to physically see the patient on the phone.  Vital signs not available since interview conducted virtually.      Assessment and Plan: Adjustment d/o with anxious mood; Bipolar II d/o  Status of current symptoms: worsening anxiety  Depakote DR 500mg  po qHS for Bipolar d/o- has helped with depression  D/c Zoloft  Start Lexapro 10mg  po qD for anxiety and depression  Labs: ordered Depakote level, CBC (platelets), LFT's    Follow Up Instructions: In 4 weeks or sooner if needed   I discussed the assessment and treatment plan with the patient. The patient was provided an opportunity to ask questions and all were answered. The patient agreed with the plan and demonstrated an understanding of the instructions.   The patient was advised to call back or seek an in-person evaluation if the symptoms worsen or if the condition fails to improve as anticipated.  I provided 15 minutes of non-face-to-face time during this encounter.   [name], MD

## 2019-08-30 ENCOUNTER — Other Ambulatory Visit: Payer: Self-pay

## 2019-08-30 ENCOUNTER — Encounter (HOSPITAL_COMMUNITY): Payer: Self-pay | Admitting: Psychiatry

## 2019-08-30 ENCOUNTER — Ambulatory Visit (INDEPENDENT_AMBULATORY_CARE_PROVIDER_SITE_OTHER): Payer: Medicare Other | Admitting: Psychiatry

## 2019-08-30 DIAGNOSIS — F3181 Bipolar II disorder: Secondary | ICD-10-CM

## 2019-08-30 DIAGNOSIS — G4700 Insomnia, unspecified: Secondary | ICD-10-CM | POA: Diagnosis not present

## 2019-08-30 DIAGNOSIS — F4322 Adjustment disorder with anxiety: Secondary | ICD-10-CM | POA: Diagnosis not present

## 2019-08-30 MED ORDER — ESCITALOPRAM OXALATE 10 MG PO TABS: 10.0000 mg | ORAL_TABLET | Freq: Every day | ORAL | 1 refills | Status: DC

## 2019-08-30 MED ORDER — VALPROIC ACID 250 MG PO CAPS: 500.0000 mg | ORAL_CAPSULE | Freq: Every day | ORAL | 1 refills | Status: DC

## 2019-08-30 MED ORDER — TRAZODONE HCL 50 MG PO TABS: 50.0000 mg | ORAL_TABLET | Freq: Every evening | ORAL | 1 refills | Status: DC | PRN

## 2019-08-30 NOTE — Progress Notes (Signed)
  Virtual Visit via Telephone Note  I connected with Zoe Kennedy  on 08/30/19 at  1:30 PM EST by telephone and verified that I am speaking with the correct person using two identifiers.  Location: Patient: home Provider: office   I discussed the limitations, risks, security and privacy concerns of performing an evaluation and management service by telephone and the availability of in person appointments. I also discussed with the patient that there may be a patient responsible charge related to this service. The patient expressed understanding and agreed to proceed.   History of Present Illness: "Very bad". Pt reports she is not sleeping more than 3- 4 hrs/night. She is able to able to fall asleep but not stay asleep. This results in low energy and motivation during the day. She is not exercising or reading and not doing anything during the day. Antione tries to avoid taking naps during the day. This is making her feel very depressed. [name] is having poor concentration and gets confused easily. When driving she forgets where she wanted to go. Her appetite is poor. She believes it is due to Lexapro SE of insomnia and tremors. She is having tremors. She tried benadryl and it helped some but no longer. Boneta is very anxious.     Observations/Objective: I spoke with Zoe Kennedy on the phone.  Pt was cooperative.  Pt was engaged in the conversation and answered questions appropriately.  Speech was  coherent with normal rate, tone and volume.  Mood is depressed and anxious, affect is congruent. Thought processes are coherent and circumstantial thought content is with ruminations.  She was difficult to redirect.  At times she would continue talking despite my attempts to intervene or answer questions.  Pt denies SI/HI.   Pt denies auditory and visual hallucinations and did not appear to be responding to internal stimuli.  Memory and concentration are good.  Fund of knowledge and use of language are average.   Insight and judgment are fair.  I am unable to comment on psychomotor activity, general appearance, hygiene, or eye contact as I was unable to physically see the patient on the phone.  Vital signs not available since interview conducted virtually.     Assessment and Plan: Insomnia; adjustment disorder with anxious mood; bipolar 2 disorder  Status of current symptoms: Insomnia with depression    Depakote DR 500mg  po qHS for Bipolar d/o- has helped with depression   D/c Lexapro  Start Trazodone 50mg  po qHS for insomnia  Advised to stop all OTC sleep aids  Follow Up Instructions: In 8 weeks or sooner if needed   I discussed the assessment and treatment plan with the patient. The patient was provided an opportunity to ask questions and all were answered. The patient agreed with the plan and demonstrated an understanding of the instructions.   The patient was advised to call back or seek an in-person evaluation if the symptoms worsen or if the condition fails to improve as anticipated.  I provided 35 minutes of non-face-to-face time during this encounter.   [name], MD

## 2019-11-01 ENCOUNTER — Other Ambulatory Visit: Payer: Self-pay

## 2019-11-01 ENCOUNTER — Encounter (HOSPITAL_COMMUNITY): Payer: Self-pay | Admitting: Psychiatry

## 2019-11-01 ENCOUNTER — Ambulatory Visit (INDEPENDENT_AMBULATORY_CARE_PROVIDER_SITE_OTHER): Payer: Medicare Other | Admitting: Psychiatry

## 2019-11-01 DIAGNOSIS — G4700 Insomnia, unspecified: Secondary | ICD-10-CM

## 2019-11-01 DIAGNOSIS — F3181 Bipolar II disorder: Secondary | ICD-10-CM | POA: Diagnosis not present

## 2019-11-01 MED ORDER — VALPROIC ACID 250 MG PO CAPS: 500.0000 mg | ORAL_CAPSULE | Freq: Every day | ORAL | 3 refills | Status: DC

## 2019-11-01 MED ORDER — TRAZODONE HCL 50 MG PO TABS: 50.0000 mg | ORAL_TABLET | Freq: Every evening | ORAL | 3 refills | Status: DC | PRN

## 2019-11-01 NOTE — Progress Notes (Signed)
  Virtual Visit via Telephone Note  I connected with Zoe Kennedy  on 11/01/19 at  9:45 AM EST by telephone and verified that I am speaking with the correct person using two identifiers.  Location: Patient: home Provider: office   I discussed the limitations, risks, security and privacy concerns of performing an evaluation and management service by telephone and the availability of in person appointments. I also discussed with the patient that there may be a patient responsible charge related to this service. The patient expressed understanding and agreed to proceed.   History of Present Illness: "I am better. I am not depressed". The last time she felt depressed was in October. It was triggered by a stressors. Since then she is feeling better and calmer. Sleep has improved and she is getting about 6 hrs/night. Zoe Kennedy is no longer anxious. She gets anxiety around Christmas and finds that she can't stop decorating, writing cards and spending money. Zoe Kennedy will get angry with herself for doing all these things. Zoe Kennedy also ends up focusing on negative things such as being abandoned by her family.  She is denying any current hypomanic and manic like symptoms. Zoe Kennedy denies SI/HI.     Observations/Objective: I spoke with Zoe Kennedy on the phone.  Pt was calm, pleasant and cooperative.  Pt was engaged in the conversation and answered questions appropriately.  Speech was clear and coherent with normal rate, tone and volume.  Mood is euthymic, affect is congruent. Thought processes are coherent and circumstantial.  Thought content is logical.  Pt denies SI/HI.   Pt denies auditory and visual hallucinations and did not appear to be responding to internal stimuli.  Memory and concentration are good.  Fund of knowledge and use of language are average.  Insight and judgment are fair.     I am unable to comment on psychomotor activity, general appearance, hygiene, or eye contact as I was unable to physically see  the patient on the phone.  Vital signs not available since interview conducted virtually.     Assessment and Plan: Bipolar 2 disorder; Insomnia; Adjustment d/o- resolved  Status of current symptoms: stable  Depakote DR 500mg  by mouth at bedtime for mood stability  Trazodone 50mg  by mouth at bedtime for insomnia  I will order labs at next visit  Follow Up Instructions: In 12 weeks or sooner if needed   I discussed the assessment and treatment plan with the patient. The patient was provided an opportunity to ask questions and all were answered. The patient agreed with the plan and demonstrated an understanding of the instructions.   The patient was advised to call back or seek an in-person evaluation if the symptoms worsen or if the condition fails to improve as anticipated.  I provided 20 minutes of non-face-to-face time during this encounter.   [name], MD

## 2020-01-31 ENCOUNTER — Ambulatory Visit (HOSPITAL_COMMUNITY): Payer: Medicare Other | Admitting: Psychiatry

## 2020-09-08 ENCOUNTER — Other Ambulatory Visit (HOSPITAL_COMMUNITY): Payer: Self-pay | Admitting: Psychiatry

## 2020-09-08 DIAGNOSIS — G4700 Insomnia, unspecified: Secondary | ICD-10-CM

## 2020-10-02 ENCOUNTER — Telehealth (HOSPITAL_COMMUNITY): Payer: Medicare Other | Admitting: Psychiatry

## 2020-11-27 ENCOUNTER — Other Ambulatory Visit (HOSPITAL_COMMUNITY): Payer: Self-pay | Admitting: Psychiatry

## 2020-11-27 DIAGNOSIS — F3181 Bipolar II disorder: Secondary | ICD-10-CM

## 2021-01-22 ENCOUNTER — Other Ambulatory Visit (HOSPITAL_COMMUNITY): Payer: Self-pay | Admitting: Psychiatry

## 2021-01-22 DIAGNOSIS — F3181 Bipolar II disorder: Secondary | ICD-10-CM

## 2021-08-25 DEATH — deceased

## 2023-08-05 ENCOUNTER — Telehealth: Payer: Self-pay

## 2023-08-05 NOTE — Telephone Encounter (Signed)
Pt arrived to the office, visible confused, asking to be seen by a provider. She is not a pt here. She is alone and drove here. She has a business card for [facility] up the street. Contacted the office, pt had an appt scheduled there today at 1:40pm. The provider at [facility] suggested contacting EMS to transport pt to the ED.   Non-Emergency EMS contacted at [phone].

## 2023-08-09 ENCOUNTER — Ambulatory Visit: Payer: Self-pay | Admitting: Nurse Practitioner

## 2023-12-24 DEATH — deceased
